# Patient Record
Sex: Female | Born: 1945
Health system: Southern US, Community
[De-identification: ages and names within clinical notes are randomized; demographics above are authoritative.]

## PROBLEM LIST (undated history)

## (undated) DIAGNOSIS — C4491 Basal cell carcinoma of skin, unspecified: Secondary | ICD-10-CM

## (undated) DIAGNOSIS — Z86010 Personal history of colonic polyps: Secondary | ICD-10-CM

## (undated) DIAGNOSIS — K635 Polyp of colon: Secondary | ICD-10-CM

## (undated) DIAGNOSIS — E785 Hyperlipidemia, unspecified: Secondary | ICD-10-CM

## (undated) DIAGNOSIS — M8580 Other specified disorders of bone density and structure, unspecified site: Secondary | ICD-10-CM

## (undated) DIAGNOSIS — I10 Essential (primary) hypertension: Secondary | ICD-10-CM

## (undated) HISTORY — PX: COLONOSCOPY: SHX174

## (undated) HISTORY — DX: Essential (primary) hypertension: I10

## (undated) HISTORY — DX: Personal history of colonic polyps: Z86.010

## (undated) HISTORY — DX: Other specified disorders of bone density and structure, unspecified site: M85.80

## (undated) HISTORY — DX: Hyperlipidemia, unspecified: E78.5

## (undated) HISTORY — DX: Polyp of colon: K63.5

## (undated) HISTORY — DX: Basal cell carcinoma of skin, unspecified: C44.91

---

## 1996-08-19 HISTORY — PX: DILATION AND CURETTAGE OF UTERUS: SHX78

## 1998-09-18 ENCOUNTER — Other Ambulatory Visit: Admission: RE | Admit: 1998-09-18 | Discharge: 1998-09-18 | Payer: Self-pay | Admitting: Obstetrics and Gynecology

## 1999-10-25 ENCOUNTER — Other Ambulatory Visit: Admission: RE | Admit: 1999-10-25 | Discharge: 1999-10-25 | Payer: Self-pay | Admitting: Obstetrics and Gynecology

## 1999-11-13 ENCOUNTER — Encounter: Payer: Self-pay | Admitting: Obstetrics and Gynecology

## 1999-11-13 ENCOUNTER — Encounter: Admission: RE | Admit: 1999-11-13 | Discharge: 1999-11-13 | Payer: Self-pay | Admitting: Obstetrics and Gynecology

## 2000-11-03 ENCOUNTER — Other Ambulatory Visit: Admission: RE | Admit: 2000-11-03 | Discharge: 2000-11-03 | Payer: Self-pay | Admitting: Obstetrics and Gynecology

## 2000-11-20 ENCOUNTER — Encounter: Admission: RE | Admit: 2000-11-20 | Discharge: 2000-11-20 | Payer: Self-pay | Admitting: Obstetrics and Gynecology

## 2000-11-20 ENCOUNTER — Encounter: Payer: Self-pay | Admitting: Obstetrics and Gynecology

## 2001-11-05 ENCOUNTER — Other Ambulatory Visit: Admission: RE | Admit: 2001-11-05 | Discharge: 2001-11-05 | Payer: Self-pay | Admitting: Obstetrics and Gynecology

## 2001-11-27 ENCOUNTER — Encounter: Payer: Self-pay | Admitting: Obstetrics and Gynecology

## 2001-11-27 ENCOUNTER — Encounter: Admission: RE | Admit: 2001-11-27 | Discharge: 2001-11-27 | Payer: Self-pay | Admitting: Obstetrics and Gynecology

## 2002-11-08 ENCOUNTER — Other Ambulatory Visit: Admission: RE | Admit: 2002-11-08 | Discharge: 2002-11-08 | Payer: Self-pay | Admitting: Obstetrics and Gynecology

## 2002-12-01 ENCOUNTER — Encounter: Payer: Self-pay | Admitting: Obstetrics and Gynecology

## 2002-12-01 ENCOUNTER — Encounter: Admission: RE | Admit: 2002-12-01 | Discharge: 2002-12-01 | Payer: Self-pay | Admitting: Obstetrics and Gynecology

## 2003-09-14 ENCOUNTER — Other Ambulatory Visit: Admission: RE | Admit: 2003-09-14 | Discharge: 2003-09-14 | Payer: Self-pay | Admitting: Family Medicine

## 2003-12-02 ENCOUNTER — Ambulatory Visit (HOSPITAL_COMMUNITY): Admission: RE | Admit: 2003-12-02 | Discharge: 2003-12-02 | Payer: Self-pay | Admitting: Family Medicine

## 2004-10-08 ENCOUNTER — Other Ambulatory Visit: Admission: RE | Admit: 2004-10-08 | Discharge: 2004-10-08 | Payer: Self-pay | Admitting: Family Medicine

## 2004-11-02 ENCOUNTER — Ambulatory Visit: Payer: Self-pay | Admitting: Internal Medicine

## 2004-11-15 ENCOUNTER — Ambulatory Visit: Payer: Self-pay | Admitting: Internal Medicine

## 2004-12-31 ENCOUNTER — Encounter: Admission: RE | Admit: 2004-12-31 | Discharge: 2004-12-31 | Payer: Self-pay | Admitting: Family Medicine

## 2005-10-16 ENCOUNTER — Other Ambulatory Visit: Admission: RE | Admit: 2005-10-16 | Discharge: 2005-10-16 | Payer: Self-pay | Admitting: Family Medicine

## 2006-01-02 ENCOUNTER — Encounter: Admission: RE | Admit: 2006-01-02 | Discharge: 2006-01-02 | Payer: Self-pay | Admitting: Family Medicine

## 2006-11-27 ENCOUNTER — Other Ambulatory Visit: Admission: RE | Admit: 2006-11-27 | Discharge: 2006-11-27 | Payer: Self-pay | Admitting: Family Medicine

## 2007-01-05 ENCOUNTER — Encounter: Admission: RE | Admit: 2007-01-05 | Discharge: 2007-01-05 | Payer: Self-pay | Admitting: Family Medicine

## 2007-12-08 ENCOUNTER — Other Ambulatory Visit: Admission: RE | Admit: 2007-12-08 | Discharge: 2007-12-08 | Payer: Self-pay | Admitting: Family Medicine

## 2008-01-06 ENCOUNTER — Encounter: Admission: RE | Admit: 2008-01-06 | Discharge: 2008-01-06 | Payer: Self-pay | Admitting: Family Medicine

## 2008-01-18 ENCOUNTER — Encounter: Admission: RE | Admit: 2008-01-18 | Discharge: 2008-01-18 | Payer: Self-pay | Admitting: Family Medicine

## 2009-01-06 ENCOUNTER — Encounter: Admission: RE | Admit: 2009-01-06 | Discharge: 2009-01-06 | Payer: Self-pay | Admitting: Family Medicine

## 2009-12-12 ENCOUNTER — Other Ambulatory Visit: Admission: RE | Admit: 2009-12-12 | Discharge: 2009-12-12 | Payer: Self-pay | Admitting: Family Medicine

## 2010-01-08 ENCOUNTER — Encounter: Admission: RE | Admit: 2010-01-08 | Discharge: 2010-01-08 | Payer: Self-pay | Admitting: Family Medicine

## 2010-09-09 ENCOUNTER — Encounter: Payer: Self-pay | Admitting: Family Medicine

## 2010-12-17 ENCOUNTER — Other Ambulatory Visit: Payer: Self-pay | Admitting: Family Medicine

## 2010-12-17 ENCOUNTER — Other Ambulatory Visit (HOSPITAL_COMMUNITY): Payer: Self-pay | Admitting: Family Medicine

## 2010-12-17 DIAGNOSIS — Z1231 Encounter for screening mammogram for malignant neoplasm of breast: Secondary | ICD-10-CM

## 2011-01-11 ENCOUNTER — Ambulatory Visit
Admission: RE | Admit: 2011-01-11 | Discharge: 2011-01-11 | Disposition: A | Discharge status: Home / Self Care | Payer: MEDICARE | Source: Ambulatory Visit | Attending: Family Medicine | Admitting: Family Medicine

## 2011-01-11 DIAGNOSIS — Z1231 Encounter for screening mammogram for malignant neoplasm of breast: Secondary | ICD-10-CM

## 2011-10-21 ENCOUNTER — Encounter: Payer: Self-pay | Admitting: Internal Medicine

## 2011-12-23 ENCOUNTER — Other Ambulatory Visit: Payer: Self-pay | Admitting: Family Medicine

## 2011-12-23 DIAGNOSIS — Z1231 Encounter for screening mammogram for malignant neoplasm of breast: Secondary | ICD-10-CM

## 2012-01-22 ENCOUNTER — Ambulatory Visit
Admission: RE | Admit: 2012-01-22 | Discharge: 2012-01-22 | Disposition: A | Discharge status: Home / Self Care | Payer: Medicare Other | Source: Ambulatory Visit | Attending: Family Medicine | Admitting: Family Medicine

## 2012-01-22 DIAGNOSIS — Z1231 Encounter for screening mammogram for malignant neoplasm of breast: Secondary | ICD-10-CM

## 2012-02-04 ENCOUNTER — Other Ambulatory Visit: Payer: Self-pay

## 2012-04-15 ENCOUNTER — Other Ambulatory Visit: Payer: Self-pay

## 2012-08-19 DIAGNOSIS — K635 Polyp of colon: Secondary | ICD-10-CM

## 2012-08-19 HISTORY — DX: Polyp of colon: K63.5

## 2012-09-10 ENCOUNTER — Encounter: Payer: Self-pay | Admitting: Internal Medicine

## 2012-09-25 ENCOUNTER — Encounter: Payer: Self-pay | Admitting: *Deleted

## 2012-10-05 ENCOUNTER — Encounter: Payer: Self-pay | Admitting: Internal Medicine

## 2012-10-09 ENCOUNTER — Encounter: Payer: Self-pay | Admitting: *Deleted

## 2012-11-04 ENCOUNTER — Ambulatory Visit (INDEPENDENT_AMBULATORY_CARE_PROVIDER_SITE_OTHER): Payer: Medicare Other | Admitting: Internal Medicine

## 2012-11-04 ENCOUNTER — Encounter: Payer: Self-pay | Admitting: Internal Medicine

## 2012-11-04 VITALS — BP 134/80 | HR 76 | Ht <= 58 in | Wt 132.5 lb

## 2012-11-04 DIAGNOSIS — R198 Other specified symptoms and signs involving the digestive system and abdomen: Secondary | ICD-10-CM

## 2012-11-04 MED ORDER — MOVIPREP 100 G PO SOLR: 1.0000 | Freq: Once | ORAL | Status: DC

## 2012-11-04 NOTE — Patient Instructions (Addendum)
You have been scheduled for an colonoscopy with propofol. Please follow written instructions given to you at your visit today. If you use inhalers (even only as needed), please bring them with you on the day of your procedure.   CC: Dr Merri Brunette

## 2012-11-04 NOTE — Progress Notes (Signed)
Katherine Chang [birth year] MRN [id number]  History of Present Illness:  This is a 66 year old white female with change in her bowel habits of several months duration. She describes narrow stools which are soft. There is no abdominal pain or bleeding. She claims to be eating a high fiber diet but her level of activity has diminished in the past few years. She has gained 15 pounds since I saw her in 2006. There is a family history of colon cancer in her paternal grandmother. Her medications include a beta blocker, calcium supplements and simvastatin. She recently had a home test for occult blood in the stool given by Dr. Katrinka Blazing which was negative.   Past Medical History  Diagnosis Date  . Hypertension   . Hyperlipidemia   . Osteopenia   . Basal cell carcinoma    Past Surgical History  Procedure Laterality Date  . Dilation and curettage of uterus      reports that she has never smoked. She has never used smokeless tobacco. She reports that she does not drink alcohol or use illicit drugs. family history includes Breast cancer in her paternal aunt; Colon cancer in her maternal grandmother; Diabetes in her mother; Hypertension in her mother; Lung cancer in her maternal grandfather; and Stroke in her paternal grandmother. No Known Allergies      Review of Systems: Negative for abdominal pain rectal bleeding chest pain  The remainder of the 10 point ROS is negative except as outlined in H&P   Physical Exam: General appearance  Well developed, in no distress. Eyes- non icteric. HEENT nontraumatic, normocephalic. Mouth no lesions, tongue papillated, no cheilosis. Neck supple without adenopathy, thyroid not enlarged, no carotid bruits, no JVD. Lungs Clear to auscultation bilaterally. Cor normal S1, normal S2, regular rhythm, no murmur,  quiet precordium. Abdomen: Soft relaxed abdomen with normal active bowel sounds. No tenderness. No fullness or mass. Liver edge at costal margin. Rectal:  Large amount of soft Hemoccult negative stool in the rectal ampulla. Extremities no pedal edema. Skin no lesions. Neurological alert and oriented x 3. Psychological normal mood and affect.  Assessment and Plan:  Problem #59 67 year old white female with change in the caliber of her stools which is concerning for possible malignancy. She is Hemoccult-negative. There is family history of colon cancer in an indirect relative and she is due for a screening colonoscopy. We have discussed increasing the fiber in her diet; specifically adding Benefiber to her cereal in the morning which would increase the bulk in the stool. She will continue to drink juice and start walking in [facility] 3 times a week like she did last year. We will schedule her for a colonoscopy. We discussed the prep, sedation and the procedure.   11/04/2012 Lina Sar

## 2012-11-05 ENCOUNTER — Encounter: Payer: Self-pay | Admitting: Internal Medicine

## 2012-11-18 ENCOUNTER — Telehealth: Payer: Self-pay | Admitting: Internal Medicine

## 2012-11-18 NOTE — Telephone Encounter (Signed)
Patient had questions regarding the amount of liquid that she needed to drink throughout the day on the day before her prep as well as questions regarding propofol sedation and nausea. Questions have been answered.

## 2012-11-20 ENCOUNTER — Ambulatory Visit (AMBULATORY_SURGERY_CENTER): Payer: Medicare Other | Admitting: Internal Medicine

## 2012-11-20 ENCOUNTER — Encounter: Payer: Self-pay | Admitting: Internal Medicine

## 2012-11-20 VITALS — BP 109/64 | HR 75 | Temp 98.7°F | Resp 19 | Ht <= 58 in | Wt 132.0 lb

## 2012-11-20 DIAGNOSIS — D126 Benign neoplasm of colon, unspecified: Secondary | ICD-10-CM

## 2012-11-20 DIAGNOSIS — Z860100 Personal history of colon polyps, unspecified: Secondary | ICD-10-CM

## 2012-11-20 DIAGNOSIS — R198 Other specified symptoms and signs involving the digestive system and abdomen: Secondary | ICD-10-CM

## 2012-11-20 HISTORY — DX: Personal history of colonic polyps: Z86.010

## 2012-11-20 HISTORY — DX: Personal history of colon polyps, unspecified: Z86.0100

## 2012-11-20 MED ORDER — SODIUM CHLORIDE 0.9 % IV SOLN: 500.0000 mL | INTRAVENOUS | Status: DC

## 2012-11-20 NOTE — Patient Instructions (Addendum)

## 2012-11-20 NOTE — Progress Notes (Signed)
Called to room to assist during endoscopic procedure.  Patient ID and intended procedure confirmed with present staff. Received instructions for my participation in the procedure from the performing physician. ewm 

## 2012-11-20 NOTE — Op Note (Signed)
[facility] [address] [place], [address]   COLONOSCOPY PROCEDURE REPORT  PATIENT: Katherine, Chang  MR#: [id number] BIRTHDATE: Oct 17, 1945 , 67  yrs. old GENDER: Female ENDOSCOPIST: Hart Carwin, MD REFERRED BY:  Merri Brunette, M.D. PROCEDURE DATE:  11/20/2012 PROCEDURE:   Colonoscopy with snare polypectomy ASA CLASS:   Class II INDICATIONS:Average risk patient for colon cancer and Family history of colon cancer, distant relative(s).(grandparent), last colon about 8 years ago MEDICATIONS: MAC sedation, administered by CRNA and propofol (Diprivan) 300mg  IV  DESCRIPTION OF PROCEDURE:   After the risks and benefits and of the procedure were explained, informed consent was obtained.  A digital rectal exam revealed no abnormalities of the rectum.    The LB PCF-[id number]  endoscope was introduced through the anus and advanced to the cecum, which was identified by both the appendix and ileocecal valve .  The quality of the prep was good, using MoviPrep .  The instrument was then slowly withdrawn as the colon was fully examined.     COLON FINDINGS: A smooth sessile polyp ranging between 5-68mm in size was found in the ascending colon.  A polypectomy was performed with a cold snare.  The resection was complete and the polyp tissue was completely retrieved.     Retroflexed views revealed no abnormalities.     The scope was then withdrawn from the patient and the procedure completed.  COMPLICATIONS: There were no complications. ENDOSCOPIC IMPRESSION: Sessile polyp ranging between 5-37mm in size was found in the ascending colon; polypectomy was performed with a cold snare  RECOMMENDATIONS: 1.  Await pathology results 2.  High fiber diet   REPEAT EXAM: for Colonoscopy, pending biopsy results.  cc:  _______________________________ eSignedHart Carwin, MD 11/20/2012 12:44 PM     PATIENT NAME:  Katherine, Chang MR#: [id number]

## 2012-11-23 ENCOUNTER — Telehealth: Payer: Self-pay | Admitting: *Deleted

## 2012-11-23 NOTE — Telephone Encounter (Signed)
  Follow up Call-  Call back number 11/20/2012  Post procedure Call Back phone  # [phone]  Permission to leave phone message Yes     Patient questions:  Do you have a fever, pain , or abdominal swelling? no Pain Score  0 *  Have you tolerated food without any problems? yes  Have you been able to return to your normal activities? yes  Do you have any questions about your discharge instructions: Diet   no Medications  no Follow up visit  no  Do you have questions or concerns about your Care? no  Actions: * If pain score is 4 or above: No action needed, pain <4.

## 2012-11-24 ENCOUNTER — Encounter: Payer: Self-pay | Admitting: Internal Medicine

## 2012-12-30 ENCOUNTER — Other Ambulatory Visit: Payer: Self-pay

## 2012-12-30 DIAGNOSIS — Z1231 Encounter for screening mammogram for malignant neoplasm of breast: Secondary | ICD-10-CM

## 2013-02-02 ENCOUNTER — Ambulatory Visit
Admission: RE | Admit: 2013-02-02 | Discharge: 2013-02-02 | Disposition: A | Discharge status: Home / Self Care | Payer: Medicare Other | Source: Ambulatory Visit

## 2013-02-02 DIAGNOSIS — Z1231 Encounter for screening mammogram for malignant neoplasm of breast: Secondary | ICD-10-CM

## 2013-02-03 ENCOUNTER — Ambulatory Visit: Payer: Medicare Other

## 2014-01-06 ENCOUNTER — Other Ambulatory Visit: Payer: Self-pay

## 2014-01-06 DIAGNOSIS — Z1231 Encounter for screening mammogram for malignant neoplasm of breast: Secondary | ICD-10-CM

## 2014-02-08 ENCOUNTER — Ambulatory Visit
Admission: RE | Admit: 2014-02-08 | Discharge: 2014-02-08 | Disposition: A | Discharge status: Home / Self Care | Payer: Medicare Other | Source: Ambulatory Visit

## 2014-02-08 ENCOUNTER — Encounter (INDEPENDENT_AMBULATORY_CARE_PROVIDER_SITE_OTHER): Payer: Self-pay

## 2014-02-08 DIAGNOSIS — Z1231 Encounter for screening mammogram for malignant neoplasm of breast: Secondary | ICD-10-CM

## 2014-06-14 ENCOUNTER — Other Ambulatory Visit: Payer: Self-pay

## 2015-01-06 ENCOUNTER — Other Ambulatory Visit: Payer: Self-pay

## 2015-01-06 DIAGNOSIS — Z1231 Encounter for screening mammogram for malignant neoplasm of breast: Secondary | ICD-10-CM

## 2015-02-13 ENCOUNTER — Ambulatory Visit
Admission: RE | Admit: 2015-02-13 | Discharge: 2015-02-13 | Disposition: A | Discharge status: Home / Self Care | Payer: Medicare Other | Source: Ambulatory Visit

## 2015-02-13 DIAGNOSIS — Z1231 Encounter for screening mammogram for malignant neoplasm of breast: Secondary | ICD-10-CM

## 2016-01-01 DIAGNOSIS — R609 Edema, unspecified: Secondary | ICD-10-CM | POA: Diagnosis not present

## 2016-01-01 DIAGNOSIS — I10 Essential (primary) hypertension: Secondary | ICD-10-CM | POA: Diagnosis not present

## 2016-01-01 DIAGNOSIS — E7800 Pure hypercholesterolemia, unspecified: Secondary | ICD-10-CM | POA: Diagnosis not present

## 2016-01-08 ENCOUNTER — Other Ambulatory Visit: Payer: Self-pay

## 2016-01-08 DIAGNOSIS — Z1231 Encounter for screening mammogram for malignant neoplasm of breast: Secondary | ICD-10-CM

## 2016-02-14 ENCOUNTER — Ambulatory Visit
Admission: RE | Admit: 2016-02-14 | Discharge: 2016-02-14 | Disposition: A | Discharge status: Home / Self Care | Payer: PPO | Source: Ambulatory Visit

## 2016-02-14 DIAGNOSIS — Z1231 Encounter for screening mammogram for malignant neoplasm of breast: Secondary | ICD-10-CM

## 2016-05-06 DIAGNOSIS — H43813 Vitreous degeneration, bilateral: Secondary | ICD-10-CM | POA: Diagnosis not present

## 2016-05-06 DIAGNOSIS — H25813 Combined forms of age-related cataract, bilateral: Secondary | ICD-10-CM | POA: Diagnosis not present

## 2016-05-06 DIAGNOSIS — H524 Presbyopia: Secondary | ICD-10-CM | POA: Diagnosis not present

## 2016-05-06 DIAGNOSIS — H04123 Dry eye syndrome of bilateral lacrimal glands: Secondary | ICD-10-CM | POA: Diagnosis not present

## 2016-06-18 DIAGNOSIS — D1801 Hemangioma of skin and subcutaneous tissue: Secondary | ICD-10-CM | POA: Diagnosis not present

## 2016-06-18 DIAGNOSIS — L84 Corns and callosities: Secondary | ICD-10-CM | POA: Diagnosis not present

## 2016-06-18 DIAGNOSIS — L918 Other hypertrophic disorders of the skin: Secondary | ICD-10-CM | POA: Diagnosis not present

## 2016-06-18 DIAGNOSIS — L819 Disorder of pigmentation, unspecified: Secondary | ICD-10-CM | POA: Diagnosis not present

## 2016-06-18 DIAGNOSIS — L720 Epidermal cyst: Secondary | ICD-10-CM | POA: Diagnosis not present

## 2016-06-18 DIAGNOSIS — Z85828 Personal history of other malignant neoplasm of skin: Secondary | ICD-10-CM | POA: Diagnosis not present

## 2016-06-18 DIAGNOSIS — L821 Other seborrheic keratosis: Secondary | ICD-10-CM | POA: Diagnosis not present

## 2016-06-25 DIAGNOSIS — Z23 Encounter for immunization: Secondary | ICD-10-CM | POA: Diagnosis not present

## 2016-06-25 DIAGNOSIS — Z1159 Encounter for screening for other viral diseases: Secondary | ICD-10-CM | POA: Diagnosis not present

## 2016-06-25 DIAGNOSIS — Z1389 Encounter for screening for other disorder: Secondary | ICD-10-CM | POA: Diagnosis not present

## 2016-06-25 DIAGNOSIS — Z0000 Encounter for general adult medical examination without abnormal findings: Secondary | ICD-10-CM | POA: Diagnosis not present

## 2016-06-25 DIAGNOSIS — E7800 Pure hypercholesterolemia, unspecified: Secondary | ICD-10-CM | POA: Diagnosis not present

## 2016-06-25 DIAGNOSIS — M8580 Other specified disorders of bone density and structure, unspecified site: Secondary | ICD-10-CM | POA: Diagnosis not present

## 2016-06-25 DIAGNOSIS — I10 Essential (primary) hypertension: Secondary | ICD-10-CM | POA: Diagnosis not present

## 2016-10-18 ENCOUNTER — Telehealth: Payer: Self-pay | Admitting: Gastroenterology

## 2016-10-18 ENCOUNTER — Encounter: Payer: Self-pay | Admitting: Internal Medicine

## 2016-10-18 NOTE — Telephone Encounter (Signed)
Patient is a former Dr. [name] pt. She is requesting you take over her care, as her son, Katherine Chang, is a patient of yours. Will you accept her as your patient?  Patient was just verifying that she is due for her next colonoscopy in 11/2017, as her PCP gave her recommendations for colonoscopy every 2 years. Patient has not had any changes in her bowel habits, no rectal bleeding or pain.  Please advise.

## 2016-10-18 NOTE — Telephone Encounter (Signed)
I am happy to accept  As best I can tell her colonoscopy should not be repeated until 2019 - I have reviewed the chart  The only reason I would think to do it every 2 years would be if she had something called Lynch syndrome - a genetic cancer syndrome - which do not see in chart  Perhaps there was a miscommunication

## 2016-10-18 NOTE — Telephone Encounter (Signed)
Spoke to patient, let her know that Dr. Carlean Purl said he would be happy to accept her as a patient. She understands that she will be contacted next year to schedule a colonoscopy.

## 2016-12-23 DIAGNOSIS — E7800 Pure hypercholesterolemia, unspecified: Secondary | ICD-10-CM | POA: Diagnosis not present

## 2016-12-23 DIAGNOSIS — M8580 Other specified disorders of bone density and structure, unspecified site: Secondary | ICD-10-CM | POA: Diagnosis not present

## 2016-12-23 DIAGNOSIS — I10 Essential (primary) hypertension: Secondary | ICD-10-CM | POA: Diagnosis not present

## 2017-01-10 ENCOUNTER — Other Ambulatory Visit: Payer: Self-pay | Admitting: Family Medicine

## 2017-01-10 DIAGNOSIS — Z1231 Encounter for screening mammogram for malignant neoplasm of breast: Secondary | ICD-10-CM

## 2017-02-14 ENCOUNTER — Ambulatory Visit
Admission: RE | Admit: 2017-02-14 | Discharge: 2017-02-14 | Disposition: A | Discharge status: Home / Self Care | Payer: PPO | Source: Ambulatory Visit | Attending: Family Medicine | Admitting: Family Medicine

## 2017-02-14 DIAGNOSIS — Z1231 Encounter for screening mammogram for malignant neoplasm of breast: Secondary | ICD-10-CM

## 2017-03-25 DIAGNOSIS — Z85828 Personal history of other malignant neoplasm of skin: Secondary | ICD-10-CM | POA: Diagnosis not present

## 2017-03-25 DIAGNOSIS — S20162A Insect bite (nonvenomous) of breast, left breast, initial encounter: Secondary | ICD-10-CM | POA: Diagnosis not present

## 2017-03-25 DIAGNOSIS — L820 Inflamed seborrheic keratosis: Secondary | ICD-10-CM | POA: Diagnosis not present

## 2017-06-11 DIAGNOSIS — Z23 Encounter for immunization: Secondary | ICD-10-CM | POA: Diagnosis not present

## 2017-06-18 DIAGNOSIS — D485 Neoplasm of uncertain behavior of skin: Secondary | ICD-10-CM | POA: Diagnosis not present

## 2017-06-18 DIAGNOSIS — Z85828 Personal history of other malignant neoplasm of skin: Secondary | ICD-10-CM | POA: Diagnosis not present

## 2017-06-18 DIAGNOSIS — L819 Disorder of pigmentation, unspecified: Secondary | ICD-10-CM | POA: Diagnosis not present

## 2017-06-18 DIAGNOSIS — L821 Other seborrheic keratosis: Secondary | ICD-10-CM | POA: Diagnosis not present

## 2017-06-18 DIAGNOSIS — L820 Inflamed seborrheic keratosis: Secondary | ICD-10-CM | POA: Diagnosis not present

## 2017-06-18 DIAGNOSIS — L84 Corns and callosities: Secondary | ICD-10-CM | POA: Diagnosis not present

## 2017-07-21 DIAGNOSIS — E7800 Pure hypercholesterolemia, unspecified: Secondary | ICD-10-CM | POA: Diagnosis not present

## 2017-07-21 DIAGNOSIS — I10 Essential (primary) hypertension: Secondary | ICD-10-CM | POA: Diagnosis not present

## 2017-07-21 DIAGNOSIS — Z1389 Encounter for screening for other disorder: Secondary | ICD-10-CM | POA: Diagnosis not present

## 2017-07-21 DIAGNOSIS — Z6828 Body mass index (BMI) 28.0-28.9, adult: Secondary | ICD-10-CM | POA: Diagnosis not present

## 2017-07-21 DIAGNOSIS — M8580 Other specified disorders of bone density and structure, unspecified site: Secondary | ICD-10-CM | POA: Diagnosis not present

## 2017-07-21 DIAGNOSIS — Z0000 Encounter for general adult medical examination without abnormal findings: Secondary | ICD-10-CM | POA: Diagnosis not present

## 2017-07-21 DIAGNOSIS — M859 Disorder of bone density and structure, unspecified: Secondary | ICD-10-CM | POA: Diagnosis not present

## 2017-12-11 ENCOUNTER — Encounter: Payer: Self-pay | Admitting: Internal Medicine

## 2018-01-16 ENCOUNTER — Other Ambulatory Visit: Payer: Self-pay | Admitting: Family Medicine

## 2018-01-16 DIAGNOSIS — Z1231 Encounter for screening mammogram for malignant neoplasm of breast: Secondary | ICD-10-CM

## 2018-01-19 DIAGNOSIS — E7800 Pure hypercholesterolemia, unspecified: Secondary | ICD-10-CM | POA: Diagnosis not present

## 2018-01-19 DIAGNOSIS — I10 Essential (primary) hypertension: Secondary | ICD-10-CM | POA: Diagnosis not present

## 2018-02-25 ENCOUNTER — Ambulatory Visit
Admission: RE | Admit: 2018-02-25 | Discharge: 2018-02-25 | Disposition: A | Discharge status: Home / Self Care | Payer: PPO | Source: Ambulatory Visit | Attending: Family Medicine | Admitting: Family Medicine

## 2018-02-25 DIAGNOSIS — Z1231 Encounter for screening mammogram for malignant neoplasm of breast: Secondary | ICD-10-CM | POA: Diagnosis not present

## 2018-03-16 DIAGNOSIS — M8588 Other specified disorders of bone density and structure, other site: Secondary | ICD-10-CM | POA: Diagnosis not present

## 2018-05-18 ENCOUNTER — Encounter: Payer: Self-pay | Admitting: Internal Medicine

## 2018-06-19 DIAGNOSIS — D225 Melanocytic nevi of trunk: Secondary | ICD-10-CM | POA: Diagnosis not present

## 2018-06-19 DIAGNOSIS — Z419 Encounter for procedure for purposes other than remedying health state, unspecified: Secondary | ICD-10-CM | POA: Diagnosis not present

## 2018-06-19 DIAGNOSIS — Z85828 Personal history of other malignant neoplasm of skin: Secondary | ICD-10-CM | POA: Diagnosis not present

## 2018-06-19 DIAGNOSIS — L84 Corns and callosities: Secondary | ICD-10-CM | POA: Diagnosis not present

## 2018-06-19 DIAGNOSIS — D2272 Melanocytic nevi of left lower limb, including hip: Secondary | ICD-10-CM | POA: Diagnosis not present

## 2018-06-19 DIAGNOSIS — D224 Melanocytic nevi of scalp and neck: Secondary | ICD-10-CM | POA: Diagnosis not present

## 2018-06-19 DIAGNOSIS — L821 Other seborrheic keratosis: Secondary | ICD-10-CM | POA: Diagnosis not present

## 2018-06-19 DIAGNOSIS — D1801 Hemangioma of skin and subcutaneous tissue: Secondary | ICD-10-CM | POA: Diagnosis not present

## 2018-06-22 ENCOUNTER — Ambulatory Visit (AMBULATORY_SURGERY_CENTER): Payer: Self-pay | Admitting: *Deleted

## 2018-06-22 VITALS — Ht <= 58 in | Wt 137.0 lb

## 2018-06-22 DIAGNOSIS — Z86010 Personal history of colonic polyps: Secondary | ICD-10-CM

## 2018-06-22 NOTE — Progress Notes (Signed)
Patient denies any allergies to eggs or soy. Patient denies any problems with anesthesia/sedation. Patient denies any oxygen use at home. Patient denies taking any diet/weight loss medications or blood thinners. EMMI education assisgned to patient on colonoscopy, this was explained and instructions given to patient. 

## 2018-06-23 ENCOUNTER — Encounter: Payer: Self-pay | Admitting: Internal Medicine

## 2018-06-24 DIAGNOSIS — Z23 Encounter for immunization: Secondary | ICD-10-CM | POA: Diagnosis not present

## 2018-07-06 ENCOUNTER — Encounter: Payer: Self-pay | Admitting: Internal Medicine

## 2018-07-06 ENCOUNTER — Ambulatory Visit (AMBULATORY_SURGERY_CENTER): Payer: PPO | Admitting: Internal Medicine

## 2018-07-06 VITALS — BP 130/75 | HR 72 | Temp 97.5°F | Resp 13 | Ht <= 58 in | Wt 137.0 lb

## 2018-07-06 DIAGNOSIS — Z86010 Personal history of colonic polyps: Secondary | ICD-10-CM

## 2018-07-06 DIAGNOSIS — D122 Benign neoplasm of ascending colon: Secondary | ICD-10-CM | POA: Diagnosis not present

## 2018-07-06 DIAGNOSIS — D120 Benign neoplasm of cecum: Secondary | ICD-10-CM | POA: Diagnosis not present

## 2018-07-06 DIAGNOSIS — I10 Essential (primary) hypertension: Secondary | ICD-10-CM | POA: Diagnosis not present

## 2018-07-06 MED ORDER — SODIUM CHLORIDE 0.9 % IV SOLN: 500.0000 mL | Freq: Once | INTRAVENOUS | Status: DC

## 2018-07-06 NOTE — Progress Notes (Signed)
Pt's states no medical or surgical changes since previsit or office visit. 

## 2018-07-06 NOTE — Progress Notes (Signed)
Report given to PACU, vss 

## 2018-07-06 NOTE — Op Note (Signed)
[facility] Patient Name: Katherine Chang Procedure Date: 07/06/2018 11:10 AM MRN: [id number] Endoscopist: Gatha Mayer , MD Age: 72 Referring MD:  Date of Birth: 1946-07-22 Gender: Female Account #: [id number] Procedure:                Colonoscopy Indications:              Surveillance: Personal history of adenomatous                            polyps on last colonoscopy 5 years ago Medicines:                Propofol per Anesthesia, Monitored Anesthesia Care Procedure:                Pre-Anesthesia Assessment:                           - Prior to the procedure, a History and Physical                            was performed, and patient medications and                            allergies were reviewed. The patient's tolerance of                            previous anesthesia was also reviewed. The risks                            and benefits of the procedure and the sedation                            options and risks were discussed with the patient.                            All questions were answered, and informed consent                            was obtained. Prior Anticoagulants: The patient has                            taken no previous anticoagulant or antiplatelet                            agents. ASA Grade Assessment: II - A patient with                            mild systemic disease. After reviewing the risks                            and benefits, the patient was deemed in                            satisfactory condition to undergo the procedure.  After obtaining informed consent, the colonoscope                            was passed under direct vision. Throughout the                            procedure, the patient's blood pressure, pulse, and                            oxygen saturations were monitored continuously. The                            Model PCF-[id number]) scope was introduced    through the anus and advanced to the the cecum,                            identified by appendiceal orifice and ileocecal                            valve. The colonoscopy was performed without                            difficulty. The patient tolerated the procedure                            well. The quality of the bowel preparation was                            good. The ileocecal valve, appendiceal orifice, and                            rectum were photographed. The bowel preparation                            used was Miralax. Scope In: 11:21:12 AM Scope Out: 11:36:07 AM Scope Withdrawal Time: 0 hours 12 minutes 34 seconds  Total Procedure Duration: 0 hours 14 minutes 55 seconds  Findings:                 The perianal and digital rectal examinations were                            normal.                           Two sessile polyps were found in the ascending                            colon and cecum. The polyps were diminutive in                            size. These polyps were removed with a cold snare.                            Resection and retrieval were complete.  Verification                            of patient identification for the specimen was                            done. Estimated blood loss was minimal.                           Multiple diverticula were found in the sigmoid                            colon.                           The exam was otherwise without abnormality on                            direct and retroflexion views. Complications:            No immediate complications. Estimated Blood Loss:     Estimated blood loss was minimal. Impression:               - Two diminutive polyps in the ascending colon and                            in the cecum, removed with a cold snare. Resected                            and retrieved.                           - Diverticulosis in the sigmoid colon.                           - The examination was  otherwise normal on direct                            and retroflexion views.                           - Personal history of colonic polyp 8 mm ssp 2014. Recommendation:           - Patient has a contact number available for                            emergencies. The signs and symptoms of potential                            delayed complications were discussed with the                            patient. Return to normal activities tomorrow.                            Written discharge instructions were provided to the  patient.                           - Resume previous diet.                           - Continue present medications.                           - Repeat colonoscopy is possibly recommended for                            surveillance. The colonoscopy date will be                            determined after pathology results from today's                            exam become available for review. 72 now so 77 in                            5years and may not need then. Gatha Mayer, MD 07/06/2018 11:44:53 AM This report has been signed electronically.

## 2018-07-06 NOTE — Patient Instructions (Addendum)
I found and removed 2 tiny polyps today.  I will let you know pathology results and when to have another routine colonoscopy by mail and/or My Chart.  I appreciate the opportunity to care for you. Gatha Mayer, MD, FACG YOU HAD AN ENDOSCOPIC PROCEDURE TODAY AT [facility]:   Refer to the procedure report that was given to you for any specific questions about what was found during the examination.  If the procedure report does not answer your questions, please call your gastroenterologist to clarify.  If you requested that your care partner not be given the details of your procedure findings, then the procedure report has been included in a sealed envelope for you to review at your convenience later.  YOU SHOULD EXPECT: Some feelings of bloating in the abdomen. Passage of more gas than usual.  Walking can help get rid of the air that was put into your GI tract during the procedure and reduce the bloating. If you had a lower endoscopy (such as a colonoscopy or flexible sigmoidoscopy) you may notice spotting of blood in your stool or on the toilet paper. If you underwent a bowel prep for your procedure, you may not have a normal bowel movement for a few days.  Please Note:  You might notice some irritation and congestion in your nose or some drainage.  This is from the oxygen used during your procedure.  There is no need for concern and it should clear up in a day or so.  SYMPTOMS TO REPORT IMMEDIATELY:   Following lower endoscopy (colonoscopy or flexible sigmoidoscopy):  Excessive amounts of blood in the stool  Significant tenderness or worsening of abdominal pains  Swelling of the abdomen that is new, acute  Fever of 100F or higher  For urgent or emergent issues, a gastroenterologist can be reached at any hour by calling [phone].   DIET:  We do recommend a small meal at first, but then you may proceed to your regular diet.  Drink plenty of fluids but you  should avoid alcoholic beverages for 24 hours.  MEDICATIONS:  Continue present medications.  Please see handouts given to you by your recovery nurse.  ACTIVITY:  You should plan to take it easy for the rest of today and you should NOT DRIVE or use heavy machinery until tomorrow (because of the sedation medicines used during the test).    FOLLOW UP: Our staff will call the number listed on your records the next business day following your procedure to check on you and address any questions or concerns that you may have regarding the information given to you following your procedure. If we do not reach you, we will leave a message.  However, if you are feeling well and you are not experiencing any problems, there is no need to return our call.  We will assume that you have returned to your regular daily activities without incident.  If any biopsies were taken you will be contacted by phone or by letter within the next 1-3 weeks.  Please call us at [phone] if you have not heard about the biopsies in 3 weeks.   Thank you for allowing [place] to provide for your healthcare needs today.   SIGNATURES/CONFIDENTIALITY: You and/or your care partner have signed paperwork which will be entered into your electronic medical record.  These signatures attest to the fact that that the information above on your After Visit Summary has been reviewed and is understood.  Full responsibility of the confidentiality of this discharge information lies with you and/or your care-partner.

## 2018-07-06 NOTE — Progress Notes (Signed)
Called to room to assist during endoscopic procedure.  Patient ID and intended procedure confirmed with present staff. Received instructions for my participation in the procedure from the performing physician.  

## 2018-07-07 ENCOUNTER — Telehealth: Payer: Self-pay | Admitting: *Deleted

## 2018-07-07 NOTE — Telephone Encounter (Signed)
  Follow up Call-  Call back number 07/06/2018  Post procedure Call Back phone  # [phone]  Permission to leave phone message Yes  Some recent data might be hidden     Patient questions:  Do you have a fever, pain , or abdominal swelling? No. Pain Score  0 *  Have you tolerated food without any problems? Yes.    Have you been able to return to your normal activities?yes  Do you have any questions about your discharge instructions: Diet   No. Medications  No. Follow up visit  No.  Do you have questions or concerns about your Care?no  Actions: * If pain score is 4 or above: No action needed, pain <4.

## 2018-07-07 NOTE — Telephone Encounter (Signed)
No answer for post procedure call back. Left message and will attempt to call back later this afternoon. Sm 

## 2018-07-12 ENCOUNTER — Encounter: Payer: Self-pay | Admitting: Internal Medicine

## 2018-07-12 NOTE — Progress Notes (Signed)
Diminutive ssp and adenoma Optional repeat colonoscopy 2024 (she will be 77) Will say probably not in letter and leave to her to contact us

## 2018-07-21 ENCOUNTER — Telehealth: Payer: Self-pay | Admitting: Internal Medicine

## 2018-07-21 NOTE — Telephone Encounter (Signed)
All questions were answered.  She will call back for any additional questions or concerns.

## 2018-07-22 DIAGNOSIS — I10 Essential (primary) hypertension: Secondary | ICD-10-CM | POA: Diagnosis not present

## 2018-07-22 DIAGNOSIS — Z1389 Encounter for screening for other disorder: Secondary | ICD-10-CM | POA: Diagnosis not present

## 2018-07-22 DIAGNOSIS — E7800 Pure hypercholesterolemia, unspecified: Secondary | ICD-10-CM | POA: Diagnosis not present

## 2018-07-22 DIAGNOSIS — Z0000 Encounter for general adult medical examination without abnormal findings: Secondary | ICD-10-CM | POA: Diagnosis not present

## 2018-07-22 DIAGNOSIS — M8580 Other specified disorders of bone density and structure, unspecified site: Secondary | ICD-10-CM | POA: Diagnosis not present

## 2019-01-18 ENCOUNTER — Other Ambulatory Visit: Payer: Self-pay | Admitting: Family Medicine

## 2019-01-18 DIAGNOSIS — Z1231 Encounter for screening mammogram for malignant neoplasm of breast: Secondary | ICD-10-CM

## 2019-01-21 DIAGNOSIS — I10 Essential (primary) hypertension: Secondary | ICD-10-CM | POA: Diagnosis not present

## 2019-01-21 DIAGNOSIS — E7800 Pure hypercholesterolemia, unspecified: Secondary | ICD-10-CM | POA: Diagnosis not present

## 2019-03-03 DIAGNOSIS — I10 Essential (primary) hypertension: Secondary | ICD-10-CM | POA: Diagnosis not present

## 2019-03-03 DIAGNOSIS — R7309 Other abnormal glucose: Secondary | ICD-10-CM | POA: Diagnosis not present

## 2019-03-03 DIAGNOSIS — E7800 Pure hypercholesterolemia, unspecified: Secondary | ICD-10-CM | POA: Diagnosis not present

## 2019-03-05 ENCOUNTER — Ambulatory Visit
Admission: RE | Admit: 2019-03-05 | Discharge: 2019-03-05 | Disposition: A | Discharge status: Home / Self Care | Payer: PPO | Source: Ambulatory Visit | Attending: Family Medicine | Admitting: Family Medicine

## 2019-03-05 ENCOUNTER — Other Ambulatory Visit: Payer: Self-pay

## 2019-03-05 DIAGNOSIS — Z1231 Encounter for screening mammogram for malignant neoplasm of breast: Secondary | ICD-10-CM

## 2019-03-06 ENCOUNTER — Other Ambulatory Visit: Payer: Self-pay | Admitting: Family Medicine

## 2019-03-06 DIAGNOSIS — I999 Unspecified disorder of circulatory system: Secondary | ICD-10-CM

## 2019-03-06 DIAGNOSIS — I739 Peripheral vascular disease, unspecified: Secondary | ICD-10-CM

## 2019-03-15 ENCOUNTER — Other Ambulatory Visit: Payer: PPO

## 2019-03-15 ENCOUNTER — Ambulatory Visit
Admission: RE | Admit: 2019-03-15 | Discharge: 2019-03-15 | Disposition: A | Discharge status: Home / Self Care | Payer: PPO | Source: Ambulatory Visit | Attending: Family Medicine | Admitting: Family Medicine

## 2019-03-15 DIAGNOSIS — I739 Peripheral vascular disease, unspecified: Secondary | ICD-10-CM

## 2019-03-15 DIAGNOSIS — Z8679 Personal history of other diseases of the circulatory system: Secondary | ICD-10-CM | POA: Diagnosis not present

## 2019-03-15 DIAGNOSIS — I999 Unspecified disorder of circulatory system: Secondary | ICD-10-CM

## 2019-05-12 ENCOUNTER — Other Ambulatory Visit: Payer: Self-pay

## 2019-05-12 DIAGNOSIS — Z20822 Contact with and (suspected) exposure to covid-19: Secondary | ICD-10-CM

## 2019-05-13 LAB — NOVEL CORONAVIRUS, NAA: SARS-CoV-2, NAA: NOT DETECTED

## 2019-05-13 LAB — SPECIMEN STATUS REPORT

## 2019-05-24 DIAGNOSIS — Z23 Encounter for immunization: Secondary | ICD-10-CM | POA: Diagnosis not present

## 2019-07-09 DIAGNOSIS — L821 Other seborrheic keratosis: Secondary | ICD-10-CM | POA: Diagnosis not present

## 2019-07-09 DIAGNOSIS — D2272 Melanocytic nevi of left lower limb, including hip: Secondary | ICD-10-CM | POA: Diagnosis not present

## 2019-07-09 DIAGNOSIS — D2339 Other benign neoplasm of skin of other parts of face: Secondary | ICD-10-CM | POA: Diagnosis not present

## 2019-07-09 DIAGNOSIS — L820 Inflamed seborrheic keratosis: Secondary | ICD-10-CM | POA: Diagnosis not present

## 2019-07-09 DIAGNOSIS — Z85828 Personal history of other malignant neoplasm of skin: Secondary | ICD-10-CM | POA: Diagnosis not present

## 2019-07-09 DIAGNOSIS — D2271 Melanocytic nevi of right lower limb, including hip: Secondary | ICD-10-CM | POA: Diagnosis not present

## 2019-07-09 DIAGNOSIS — D485 Neoplasm of uncertain behavior of skin: Secondary | ICD-10-CM | POA: Diagnosis not present

## 2019-07-09 DIAGNOSIS — D1801 Hemangioma of skin and subcutaneous tissue: Secondary | ICD-10-CM | POA: Diagnosis not present

## 2019-07-28 DIAGNOSIS — I10 Essential (primary) hypertension: Secondary | ICD-10-CM | POA: Diagnosis not present

## 2019-07-28 DIAGNOSIS — Z0000 Encounter for general adult medical examination without abnormal findings: Secondary | ICD-10-CM | POA: Diagnosis not present

## 2019-07-28 DIAGNOSIS — Z1389 Encounter for screening for other disorder: Secondary | ICD-10-CM | POA: Diagnosis not present

## 2019-07-28 DIAGNOSIS — E7800 Pure hypercholesterolemia, unspecified: Secondary | ICD-10-CM | POA: Diagnosis not present

## 2019-08-24 DIAGNOSIS — E7800 Pure hypercholesterolemia, unspecified: Secondary | ICD-10-CM | POA: Diagnosis not present

## 2019-08-24 DIAGNOSIS — I10 Essential (primary) hypertension: Secondary | ICD-10-CM | POA: Diagnosis not present

## 2019-09-13 ENCOUNTER — Ambulatory Visit: Payer: PPO | Attending: Internal Medicine

## 2019-09-13 DIAGNOSIS — Z23 Encounter for immunization: Secondary | ICD-10-CM | POA: Insufficient documentation

## 2019-09-13 NOTE — Progress Notes (Signed)
   Covid-19 Vaccination Clinic  Name:  Katherine Chang    MRN: [id number] DOB: 1946/07/13  09/13/2019  [name] was observed post Covid-19 immunization for 15 minutes without incidence. She was provided with Vaccine Information Sheet and instruction to access the V-Safe system.   [name] was instructed to call 911 with any severe reactions post vaccine: Marland Kitchen Difficulty breathing  . Swelling of your face and throat  . A fast heartbeat  . A bad rash all over your body  . Dizziness and weakness    Immunizations Administered    Name Date Dose VIS Date Route   Pfizer COVID-19 Vaccine 09/13/2019  9:09 AM 0.3 mL 07/30/2019 Intramuscular   Manufacturer: [place]   Lot: [id number]   [place]: [id number]

## 2019-10-04 ENCOUNTER — Ambulatory Visit: Payer: PPO

## 2019-10-04 ENCOUNTER — Ambulatory Visit: Payer: PPO | Attending: Internal Medicine

## 2019-10-04 DIAGNOSIS — Z23 Encounter for immunization: Secondary | ICD-10-CM

## 2019-10-04 NOTE — Progress Notes (Signed)
   Covid-19 Vaccination Clinic  Name:  Katherine Chang    MRN: [id number] DOB: Oct 13, 1945  10/04/2019  Ms. Lechtenberg was observed post Covid-19 immunization for 15 minutes without incidence. She was provided with Vaccine Information Sheet and instruction to access the V-Safe system.   Ms. Zittel was instructed to call 911 with any severe reactions post vaccine: Marland Kitchen Difficulty breathing  . Swelling of your face and throat  . A fast heartbeat  . A bad rash all over your body  . Dizziness and weakness    Immunizations Administered    Name Date Dose VIS Date Route   Pfizer COVID-19 Vaccine 10/04/2019 11:31 AM 0.3 mL 07/30/2019 Intramuscular   Manufacturer: [place]   Lot: [id number]   [place]: [id number]

## 2020-01-19 DIAGNOSIS — H52203 Unspecified astigmatism, bilateral: Secondary | ICD-10-CM | POA: Diagnosis not present

## 2020-01-19 DIAGNOSIS — H04123 Dry eye syndrome of bilateral lacrimal glands: Secondary | ICD-10-CM | POA: Diagnosis not present

## 2020-01-19 DIAGNOSIS — H25813 Combined forms of age-related cataract, bilateral: Secondary | ICD-10-CM | POA: Diagnosis not present

## 2020-01-19 DIAGNOSIS — H43813 Vitreous degeneration, bilateral: Secondary | ICD-10-CM | POA: Diagnosis not present

## 2020-01-27 DIAGNOSIS — E7800 Pure hypercholesterolemia, unspecified: Secondary | ICD-10-CM | POA: Diagnosis not present

## 2020-01-27 DIAGNOSIS — I10 Essential (primary) hypertension: Secondary | ICD-10-CM | POA: Diagnosis not present

## 2020-01-31 ENCOUNTER — Other Ambulatory Visit: Payer: Self-pay | Admitting: Family Medicine

## 2020-01-31 DIAGNOSIS — Z1231 Encounter for screening mammogram for malignant neoplasm of breast: Secondary | ICD-10-CM

## 2020-03-06 ENCOUNTER — Ambulatory Visit: Payer: PPO

## 2020-03-09 ENCOUNTER — Ambulatory Visit
Admission: RE | Admit: 2020-03-09 | Discharge: 2020-03-09 | Disposition: A | Discharge status: Home / Self Care | Payer: PPO | Source: Ambulatory Visit | Attending: Family Medicine | Admitting: Family Medicine

## 2020-03-09 ENCOUNTER — Other Ambulatory Visit: Payer: Self-pay

## 2020-03-09 DIAGNOSIS — Z1231 Encounter for screening mammogram for malignant neoplasm of breast: Secondary | ICD-10-CM | POA: Diagnosis not present

## 2020-06-14 DIAGNOSIS — Z23 Encounter for immunization: Secondary | ICD-10-CM | POA: Diagnosis not present

## 2020-07-07 DIAGNOSIS — L723 Sebaceous cyst: Secondary | ICD-10-CM | POA: Diagnosis not present

## 2020-07-07 DIAGNOSIS — D1801 Hemangioma of skin and subcutaneous tissue: Secondary | ICD-10-CM | POA: Diagnosis not present

## 2020-07-07 DIAGNOSIS — L570 Actinic keratosis: Secondary | ICD-10-CM | POA: Diagnosis not present

## 2020-07-07 DIAGNOSIS — L720 Epidermal cyst: Secondary | ICD-10-CM | POA: Diagnosis not present

## 2020-07-07 DIAGNOSIS — L814 Other melanin hyperpigmentation: Secondary | ICD-10-CM | POA: Diagnosis not present

## 2020-07-07 DIAGNOSIS — L819 Disorder of pigmentation, unspecified: Secondary | ICD-10-CM | POA: Diagnosis not present

## 2020-07-07 DIAGNOSIS — Z85828 Personal history of other malignant neoplasm of skin: Secondary | ICD-10-CM | POA: Diagnosis not present

## 2020-07-07 DIAGNOSIS — L821 Other seborrheic keratosis: Secondary | ICD-10-CM | POA: Diagnosis not present

## 2020-07-07 DIAGNOSIS — L738 Other specified follicular disorders: Secondary | ICD-10-CM | POA: Diagnosis not present

## 2020-07-22 IMAGING — MG DIGITAL SCREENING BILAT W/ TOMO W/ CAD
8 series · 9 of 24 positions shown · non-contrast
Comparison: Previous exam(s).

CLINICAL DATA: Screening.

EXAM:
DIGITAL SCREENING BILATERAL MAMMOGRAM WITH TOMO AND CAD

[R MLO synth-2D]
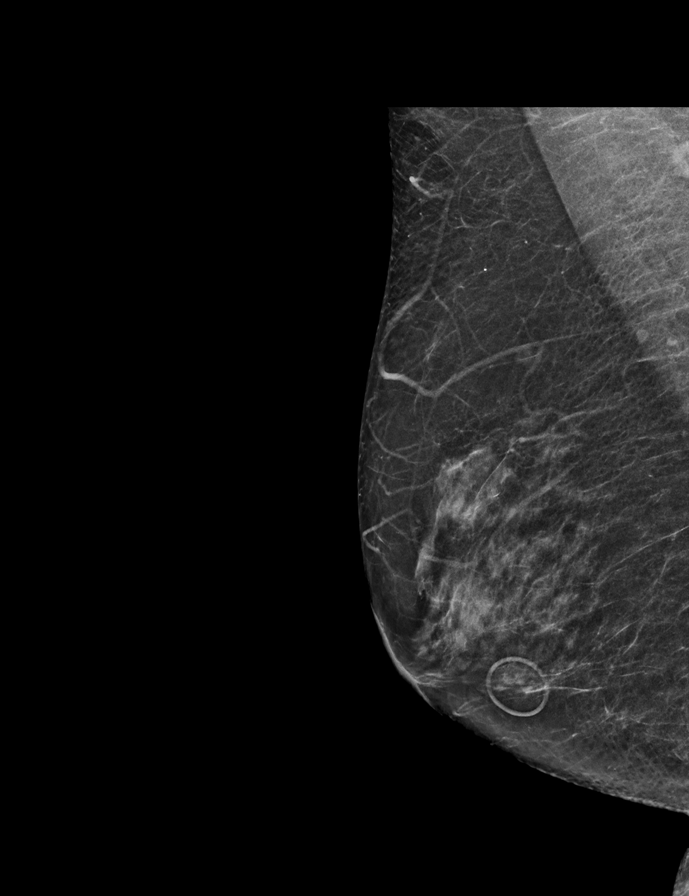

[L MLO synth-2D]
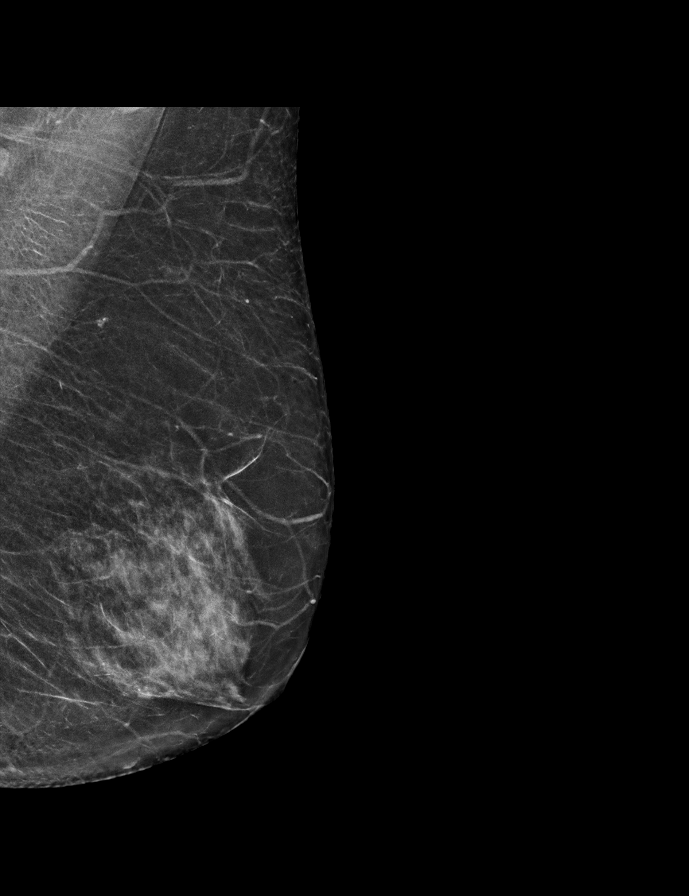

[L CC synth-2D]
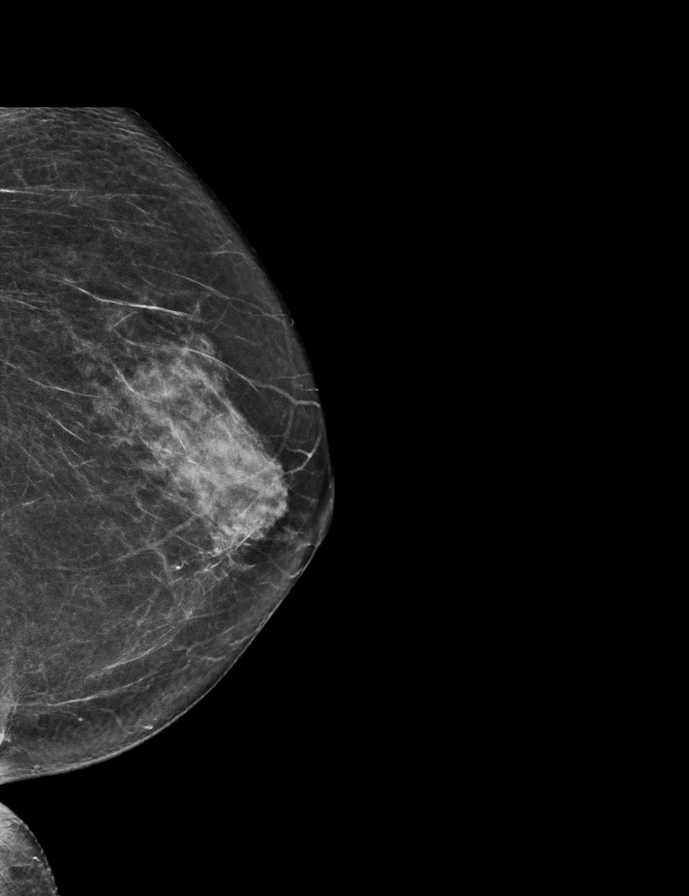

[R CC synth-2D]
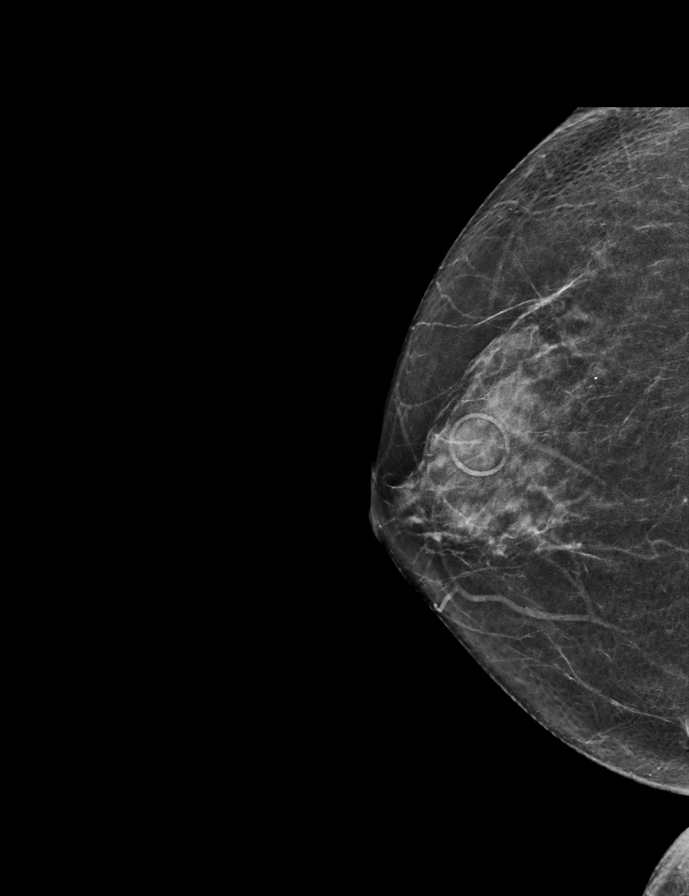

[L CC tomo · 2 of 62 frames shown]
[frame 21/62]
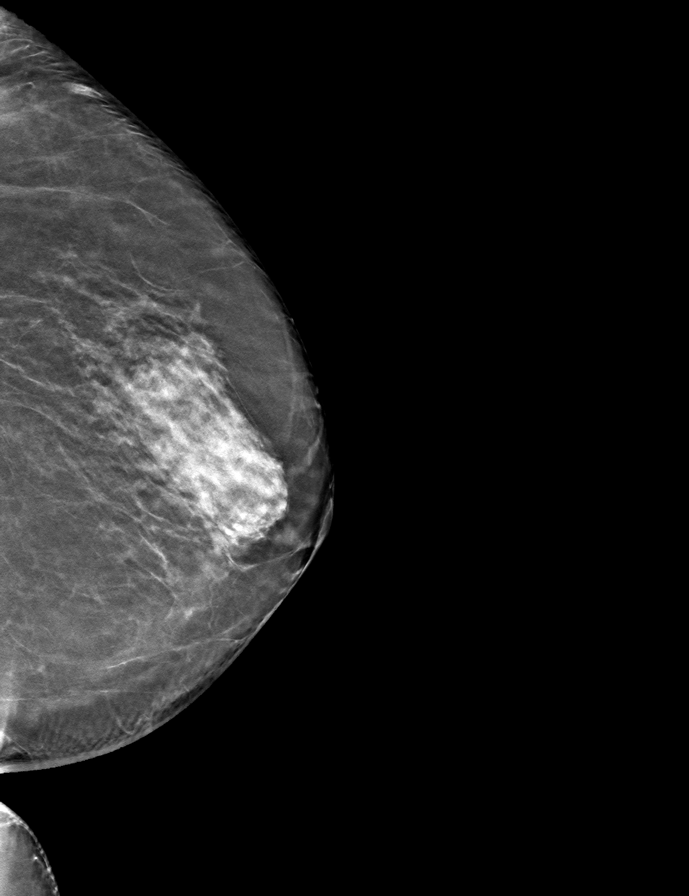
[frame 31/62]
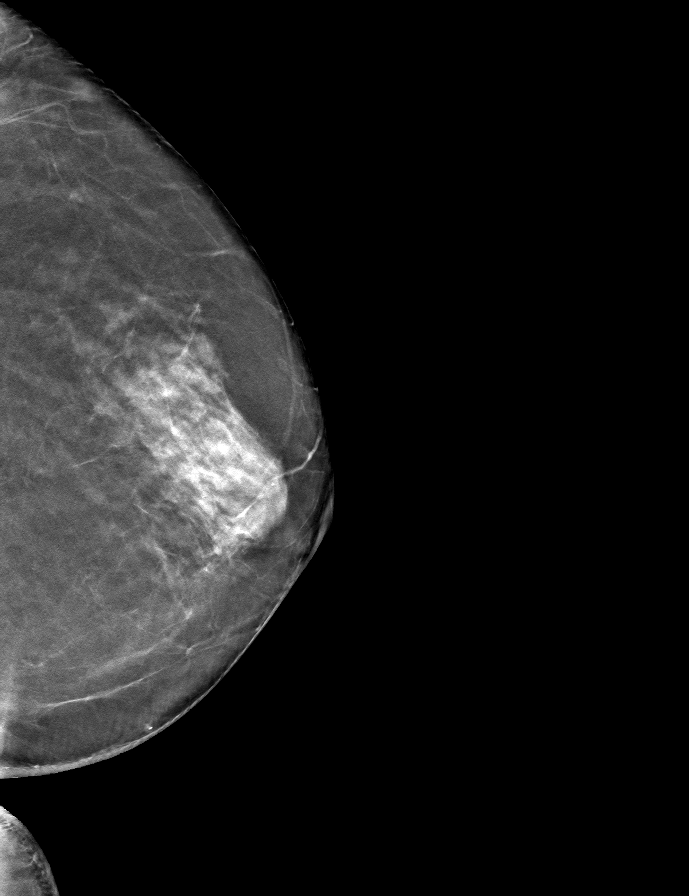

[R MLO tomo · tomo slice 29/57.0]
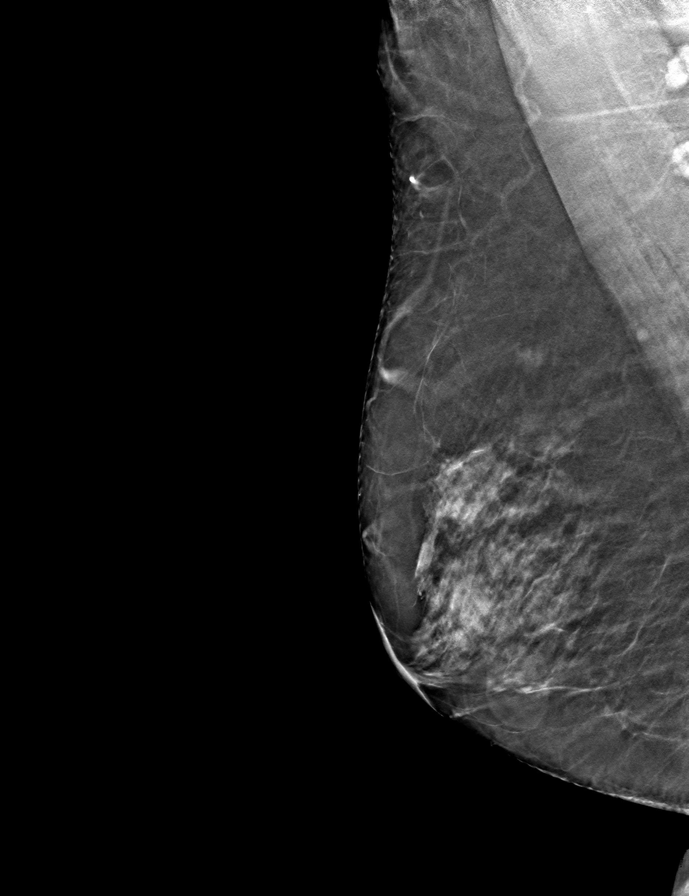

[R CC tomo · tomo slice 31/60.0]
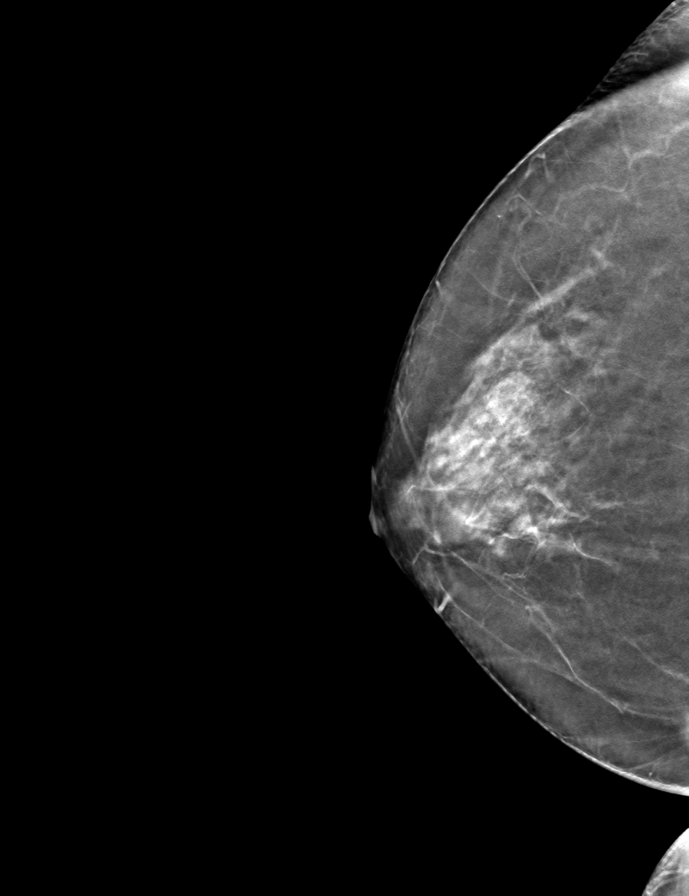

[L MLO tomo · tomo slice 32/63.0]
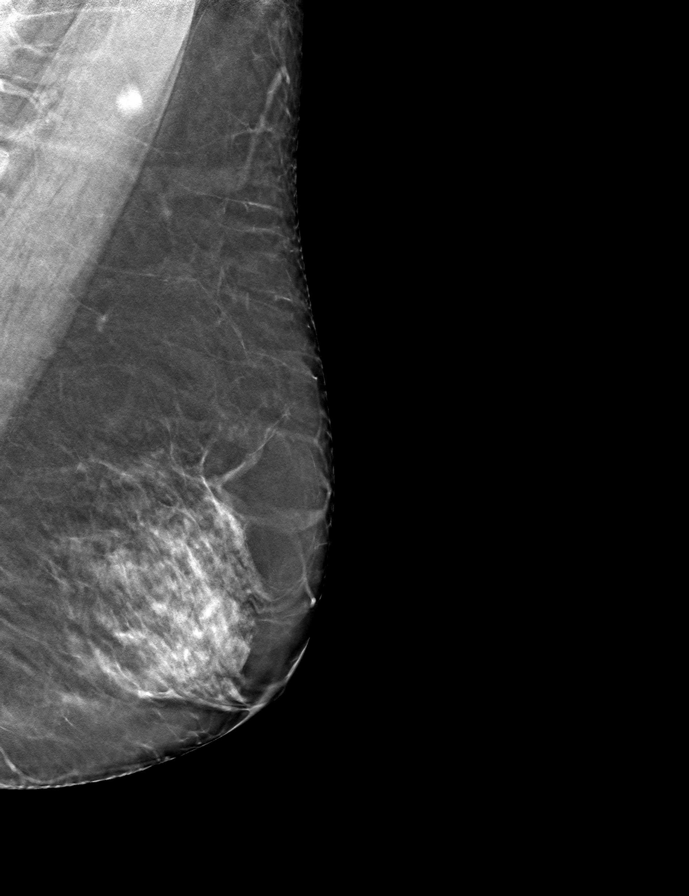

[9 of 24 positions shown; findings below may reference images not displayed]

ACR Breast Density Category c: The breast tissue is heterogeneously
dense, which may obscure small masses.
FINDINGS: There are no findings suspicious for malignancy. Images were
processed with CAD.
IMPRESSION: No mammographic evidence of malignancy. A result letter of this
screening mammogram will be mailed directly to the patient.

RECOMMENDATION:
Screening mammogram in one year. (Code:[id number]-[id number])

BI-RADS CATEGORY  1: Negative.

## 2020-07-31 DIAGNOSIS — Z0000 Encounter for general adult medical examination without abnormal findings: Secondary | ICD-10-CM | POA: Diagnosis not present

## 2020-07-31 DIAGNOSIS — I10 Essential (primary) hypertension: Secondary | ICD-10-CM | POA: Diagnosis not present

## 2020-07-31 DIAGNOSIS — Z1389 Encounter for screening for other disorder: Secondary | ICD-10-CM | POA: Diagnosis not present

## 2020-07-31 DIAGNOSIS — E7800 Pure hypercholesterolemia, unspecified: Secondary | ICD-10-CM | POA: Diagnosis not present

## 2020-11-27 DIAGNOSIS — Z85828 Personal history of other malignant neoplasm of skin: Secondary | ICD-10-CM | POA: Diagnosis not present

## 2020-11-27 DIAGNOSIS — L720 Epidermal cyst: Secondary | ICD-10-CM | POA: Diagnosis not present

## 2021-01-29 ENCOUNTER — Other Ambulatory Visit: Payer: Self-pay | Admitting: Family Medicine

## 2021-01-29 DIAGNOSIS — I10 Essential (primary) hypertension: Secondary | ICD-10-CM | POA: Diagnosis not present

## 2021-01-29 DIAGNOSIS — E7800 Pure hypercholesterolemia, unspecified: Secondary | ICD-10-CM | POA: Diagnosis not present

## 2021-01-29 DIAGNOSIS — M79675 Pain in left toe(s): Secondary | ICD-10-CM | POA: Diagnosis not present

## 2021-01-29 DIAGNOSIS — Z1231 Encounter for screening mammogram for malignant neoplasm of breast: Secondary | ICD-10-CM

## 2021-03-06 DIAGNOSIS — H43813 Vitreous degeneration, bilateral: Secondary | ICD-10-CM | POA: Diagnosis not present

## 2021-03-06 DIAGNOSIS — H2513 Age-related nuclear cataract, bilateral: Secondary | ICD-10-CM | POA: Diagnosis not present

## 2021-03-06 DIAGNOSIS — H04123 Dry eye syndrome of bilateral lacrimal glands: Secondary | ICD-10-CM | POA: Diagnosis not present

## 2021-03-06 DIAGNOSIS — H524 Presbyopia: Secondary | ICD-10-CM | POA: Diagnosis not present

## 2021-03-23 ENCOUNTER — Ambulatory Visit
Admission: RE | Admit: 2021-03-23 | Discharge: 2021-03-23 | Disposition: A | Discharge status: Home / Self Care | Payer: PPO | Source: Ambulatory Visit | Attending: Family Medicine | Admitting: Family Medicine

## 2021-03-23 ENCOUNTER — Other Ambulatory Visit: Payer: Self-pay

## 2021-03-23 DIAGNOSIS — Z1231 Encounter for screening mammogram for malignant neoplasm of breast: Secondary | ICD-10-CM | POA: Diagnosis not present

## 2021-06-13 DIAGNOSIS — Z23 Encounter for immunization: Secondary | ICD-10-CM | POA: Diagnosis not present

## 2021-07-26 DIAGNOSIS — L821 Other seborrheic keratosis: Secondary | ICD-10-CM | POA: Diagnosis not present

## 2021-07-26 DIAGNOSIS — L820 Inflamed seborrheic keratosis: Secondary | ICD-10-CM | POA: Diagnosis not present

## 2021-07-26 DIAGNOSIS — L308 Other specified dermatitis: Secondary | ICD-10-CM | POA: Diagnosis not present

## 2021-07-26 DIAGNOSIS — L720 Epidermal cyst: Secondary | ICD-10-CM | POA: Diagnosis not present

## 2021-07-26 DIAGNOSIS — L853 Xerosis cutis: Secondary | ICD-10-CM | POA: Diagnosis not present

## 2021-07-26 DIAGNOSIS — D2271 Melanocytic nevi of right lower limb, including hip: Secondary | ICD-10-CM | POA: Diagnosis not present

## 2021-07-26 DIAGNOSIS — L814 Other melanin hyperpigmentation: Secondary | ICD-10-CM | POA: Diagnosis not present

## 2021-07-26 DIAGNOSIS — Z85828 Personal history of other malignant neoplasm of skin: Secondary | ICD-10-CM | POA: Diagnosis not present

## 2021-07-26 DIAGNOSIS — D1801 Hemangioma of skin and subcutaneous tissue: Secondary | ICD-10-CM | POA: Diagnosis not present

## 2021-08-04 DIAGNOSIS — U071 COVID-19: Secondary | ICD-10-CM | POA: Diagnosis not present

## 2021-09-05 DIAGNOSIS — Z0000 Encounter for general adult medical examination without abnormal findings: Secondary | ICD-10-CM | POA: Diagnosis not present

## 2021-09-05 DIAGNOSIS — E7800 Pure hypercholesterolemia, unspecified: Secondary | ICD-10-CM | POA: Diagnosis not present

## 2021-09-05 DIAGNOSIS — I10 Essential (primary) hypertension: Secondary | ICD-10-CM | POA: Diagnosis not present

## 2021-09-05 DIAGNOSIS — R739 Hyperglycemia, unspecified: Secondary | ICD-10-CM | POA: Diagnosis not present

## 2021-09-05 DIAGNOSIS — Z1389 Encounter for screening for other disorder: Secondary | ICD-10-CM | POA: Diagnosis not present

## 2021-09-05 DIAGNOSIS — M8580 Other specified disorders of bone density and structure, unspecified site: Secondary | ICD-10-CM | POA: Diagnosis not present

## 2022-02-08 ENCOUNTER — Other Ambulatory Visit: Payer: Self-pay | Admitting: Family Medicine

## 2022-02-08 DIAGNOSIS — Z1231 Encounter for screening mammogram for malignant neoplasm of breast: Secondary | ICD-10-CM

## 2022-03-05 DIAGNOSIS — I10 Essential (primary) hypertension: Secondary | ICD-10-CM | POA: Diagnosis not present

## 2022-03-05 DIAGNOSIS — E7800 Pure hypercholesterolemia, unspecified: Secondary | ICD-10-CM | POA: Diagnosis not present

## 2022-03-05 DIAGNOSIS — N1831 Chronic kidney disease, stage 3a: Secondary | ICD-10-CM | POA: Diagnosis not present

## 2022-03-05 DIAGNOSIS — R7303 Prediabetes: Secondary | ICD-10-CM | POA: Diagnosis not present

## 2022-03-25 ENCOUNTER — Ambulatory Visit
Admission: RE | Admit: 2022-03-25 | Discharge: 2022-03-25 | Disposition: A | Discharge status: Home / Self Care | Payer: PPO | Source: Ambulatory Visit | Attending: Family Medicine | Admitting: Family Medicine

## 2022-03-25 DIAGNOSIS — Z1231 Encounter for screening mammogram for malignant neoplasm of breast: Secondary | ICD-10-CM | POA: Diagnosis not present

## 2022-03-27 DIAGNOSIS — H2513 Age-related nuclear cataract, bilateral: Secondary | ICD-10-CM | POA: Diagnosis not present

## 2022-03-27 DIAGNOSIS — H4313 Vitreous hemorrhage, bilateral: Secondary | ICD-10-CM | POA: Diagnosis not present

## 2022-03-27 DIAGNOSIS — H524 Presbyopia: Secondary | ICD-10-CM | POA: Diagnosis not present

## 2022-03-27 DIAGNOSIS — H04123 Dry eye syndrome of bilateral lacrimal glands: Secondary | ICD-10-CM | POA: Diagnosis not present

## 2022-04-01 DIAGNOSIS — Z85828 Personal history of other malignant neoplasm of skin: Secondary | ICD-10-CM | POA: Diagnosis not present

## 2022-04-01 DIAGNOSIS — L814 Other melanin hyperpigmentation: Secondary | ICD-10-CM | POA: Diagnosis not present

## 2022-07-03 DIAGNOSIS — Z23 Encounter for immunization: Secondary | ICD-10-CM | POA: Diagnosis not present

## 2022-08-07 DIAGNOSIS — M25551 Pain in right hip: Secondary | ICD-10-CM | POA: Diagnosis not present

## 2022-08-21 DIAGNOSIS — L821 Other seborrheic keratosis: Secondary | ICD-10-CM | POA: Diagnosis not present

## 2022-08-21 DIAGNOSIS — D224 Melanocytic nevi of scalp and neck: Secondary | ICD-10-CM | POA: Diagnosis not present

## 2022-08-21 DIAGNOSIS — D2239 Melanocytic nevi of other parts of face: Secondary | ICD-10-CM | POA: Diagnosis not present

## 2022-08-21 DIAGNOSIS — L820 Inflamed seborrheic keratosis: Secondary | ICD-10-CM | POA: Diagnosis not present

## 2022-08-21 DIAGNOSIS — D1801 Hemangioma of skin and subcutaneous tissue: Secondary | ICD-10-CM | POA: Diagnosis not present

## 2022-08-21 DIAGNOSIS — L814 Other melanin hyperpigmentation: Secondary | ICD-10-CM | POA: Diagnosis not present

## 2022-08-21 DIAGNOSIS — L308 Other specified dermatitis: Secondary | ICD-10-CM | POA: Diagnosis not present

## 2022-08-21 DIAGNOSIS — Z85828 Personal history of other malignant neoplasm of skin: Secondary | ICD-10-CM | POA: Diagnosis not present

## 2022-08-27 DIAGNOSIS — I10 Essential (primary) hypertension: Secondary | ICD-10-CM | POA: Diagnosis not present

## 2022-08-27 DIAGNOSIS — E7800 Pure hypercholesterolemia, unspecified: Secondary | ICD-10-CM | POA: Diagnosis not present

## 2022-08-27 DIAGNOSIS — R7303 Prediabetes: Secondary | ICD-10-CM | POA: Diagnosis not present

## 2022-08-29 DIAGNOSIS — D649 Anemia, unspecified: Secondary | ICD-10-CM | POA: Diagnosis not present

## 2022-08-29 DIAGNOSIS — N1831 Chronic kidney disease, stage 3a: Secondary | ICD-10-CM | POA: Diagnosis not present

## 2022-08-29 DIAGNOSIS — R7303 Prediabetes: Secondary | ICD-10-CM | POA: Diagnosis not present

## 2022-08-29 DIAGNOSIS — Z1331 Encounter for screening for depression: Secondary | ICD-10-CM | POA: Diagnosis not present

## 2022-08-29 DIAGNOSIS — I10 Essential (primary) hypertension: Secondary | ICD-10-CM | POA: Diagnosis not present

## 2022-08-29 DIAGNOSIS — Z23 Encounter for immunization: Secondary | ICD-10-CM | POA: Diagnosis not present

## 2022-08-29 DIAGNOSIS — E7800 Pure hypercholesterolemia, unspecified: Secondary | ICD-10-CM | POA: Diagnosis not present

## 2022-08-29 DIAGNOSIS — Z0000 Encounter for general adult medical examination without abnormal findings: Secondary | ICD-10-CM | POA: Diagnosis not present

## 2022-10-21 DIAGNOSIS — D649 Anemia, unspecified: Secondary | ICD-10-CM | POA: Diagnosis not present

## 2022-11-14 DIAGNOSIS — D649 Anemia, unspecified: Secondary | ICD-10-CM | POA: Diagnosis not present

## 2022-12-25 ENCOUNTER — Telehealth: Payer: Self-pay | Admitting: Internal Medicine

## 2022-12-25 NOTE — Telephone Encounter (Signed)
Inbound call from patient, states she is having some changes in her bowel habits and unexplained weight loss. Patient is currently scheduled for 5/31 but would like to speak to a nurse for a sooner appointment.

## 2022-12-27 NOTE — Telephone Encounter (Signed)
Spoke with patient & she stated she has noticed a change in her bowel movements, and has lost 5 lbs since January unintentionally. On Wednesday, she said she had 3 bowel movements that were "normal, then a blob, then liquid."  Denies any abdominal pain. PCP sent in general referral for mild anemia & 3 guiaiac stool cards. She was given soonest appointment with APP on 01/17/23. Advised patient that I have her on my list to contact if a cancellation becomes available & if new symptoms develop to contact PCP office for sooner appointment in the meantime. Pt verbalized all understanding.

## 2022-12-31 ENCOUNTER — Telehealth: Payer: Self-pay

## 2022-12-31 NOTE — Telephone Encounter (Signed)
Pt was rescheduled for a sooner appointment: Pt scheduled for 01/07/2023 at 10:30 AM with Amy Esterwood. PA. Pt made aware. Pt verbalized understanding with all questions answered.

## 2023-01-07 ENCOUNTER — Other Ambulatory Visit (INDEPENDENT_AMBULATORY_CARE_PROVIDER_SITE_OTHER): Payer: PPO

## 2023-01-07 ENCOUNTER — Encounter: Payer: Self-pay | Admitting: Physician Assistant

## 2023-01-07 ENCOUNTER — Ambulatory Visit: Payer: PPO | Admitting: Physician Assistant

## 2023-01-07 VITALS — BP 130/70 | HR 72 | Ht <= 58 in | Wt 123.0 lb

## 2023-01-07 DIAGNOSIS — D649 Anemia, unspecified: Secondary | ICD-10-CM

## 2023-01-07 DIAGNOSIS — R195 Other fecal abnormalities: Secondary | ICD-10-CM

## 2023-01-07 DIAGNOSIS — Z86010 Personal history of colonic polyps: Secondary | ICD-10-CM | POA: Diagnosis not present

## 2023-01-07 DIAGNOSIS — R634 Abnormal weight loss: Secondary | ICD-10-CM

## 2023-01-07 LAB — CBC WITH DIFFERENTIAL/PLATELET
Basophils Absolute: 0 10*3/uL (ref 0.0–0.1)
Basophils Relative: 0.8 % (ref 0.0–3.0)
Eosinophils Absolute: 0 10*3/uL (ref 0.0–0.7)
Eosinophils Relative: 0.6 % (ref 0.0–5.0)
HCT: 37.5 % (ref 36.0–46.0)
Hemoglobin: 12.3 g/dL (ref 12.0–15.0)
Lymphocytes Relative: 14.6 % (ref 12.0–46.0)
Lymphs Abs: 0.9 10*3/uL (ref 0.7–4.0)
MCHC: 32.9 g/dL (ref 30.0–36.0)
MCV: 92.5 fl (ref 78.0–100.0)
Monocytes Absolute: 0.6 10*3/uL (ref 0.1–1.0)
Monocytes Relative: 9.8 % (ref 3.0–12.0)
Neutro Abs: 4.8 10*3/uL (ref 1.4–7.7)
Neutrophils Relative %: 74.2 % (ref 43.0–77.0)
Platelets: 236 10*3/uL (ref 150.0–400.0)
RBC: 4.05 Mil/uL (ref 3.87–5.11)
RDW: 12.5 % (ref 11.5–15.5)
WBC: 6.4 10*3/uL (ref 4.0–10.5)

## 2023-01-07 LAB — IBC + FERRITIN
Ferritin: 76.5 ng/mL (ref 10.0–291.0)
Iron: 98 ug/dL (ref 42–145)
Saturation Ratios: 28.8 % (ref 20.0–50.0)
TIBC: 340.2 ug/dL (ref 250.0–450.0)
Transferrin: 243 mg/dL (ref 212.0–360.0)

## 2023-01-07 NOTE — Patient Instructions (Addendum)
_______________________________________________________  If your blood pressure at your visit was 140/90 or greater, please contact your primary care physician to follow up on this.  _______________________________________________________  If you are age 77 or older, your body mass index should be between 23-30. Your Body mass index is 26.62 kg/m. If this is out of the aforementioned range listed, please consider follow up with your Primary Care Provider.  If you are age 1 or younger, your body mass index should be between 19-25. Your Body mass index is 26.62 kg/m. If this is out of the aformentioned range listed, please consider follow up with your Primary Care Provider.  ________________________________________________________  The [facility] providers would like to encourage you to use [facility] to communicate with providers for non-urgent requests or questions.  Due to [place] hold times on the telephone, sending your provider a message by [facility] may be a faster and more efficient way to get a response.  Please allow 48 business hours for a response.  Please remember that this is for non-urgent requests.  _______________________________________________________  Your provider has requested that you go to the basement level for lab work before leaving today. Press "B" on the elevator. The lab is located at the first door on the left as you exit the elevator.  You have been scheduled for an endoscopy and colonoscopy. Please follow the written instructions given to you at your visit today. Please pick up your prep supplies at the pharmacy within the next 1-3 days. If you use inhalers (even only as needed), please bring them with you on the day of your procedure.  Due to recent changes in healthcare laws, you may see the results of your imaging and laboratory studies on MyChart before your provider has had a chance to review them.  We understand that in some cases there may be results that are  confusing or concerning to you. Not all laboratory results come back in the same time frame and the provider may be waiting for multiple results in order to interpret others.  Please give [place] 48 hours in order for your provider to thoroughly review all the results before contacting the office for clarification of your results.   Thank you for entrusting me with your care and choosing [facility].  Amy Esterwood, PA-C

## 2023-01-07 NOTE — Progress Notes (Signed)
Subjective:    Patient ID: Katherine Chang, female    DOB: [birth year], 77 y.o.   MRN: [id number]  HPI Katherine Chang is a pleasant 77 year old white female, established with Dr. Leone Payor.  She is referred back today by Dr. Merri Brunette for evaluation of new finding of mild anemia, and Hemoccult positive stool. Patient had recent labs done in March 2024 with finding of WBC 6.3/hemoglobin 11.5/hematocrit 35.4/MCV 92.6 and platelets 224. In January 2024 hemoglobin was 11.7, and January 2023 hemoglobin 12.4.  Patient subsequently did Hemoccult cards which were positive x 3. TSH 1.4 22 August 2022 She has not noted any melena or hematochezia.  She generally feels well, appetite has been fine.  She denies any nausea, no regular heartburn or indigestion symptoms.  She has gradually lost about 10 pounds over the past year. No regular aspirin or NSAIDs. She has not really noticed any recent changes in her bowel habits.  She says over the past couple of years bowel habits have alternated between normal bowel movements, semiformed stools and sometimes narrower stools.  She does have history of adenomatous and sessile serrated polyps, last colonoscopy November 2019 with multiple diverticuli and 2 sessile polyps removed, 1 of which was a tubular adenoma and 1 a sessile serrated polyp.  These were diminutive.  Indicated for 5-year interval follow-up though slated as optional due to age. She has not had prior EGD. Generally in good health with history of hypertension and hyperlipidemia, and osteopenia.  Review of Systems Pertinent positive and negative review of systems were noted in the above HPI section.  All other review of systems was otherwise negative.   Outpatient Encounter Medications as of 01/07/2023  Medication Sig   atenolol (TENORMIN) 50 MG tablet Take 50 mg by mouth daily.   lisinopril (ZESTRIL) 20 MG tablet Take 20 mg by mouth daily.   simvastatin (ZOCOR) 20 MG tablet Take 20 mg by mouth daily.    [DISCONTINUED] atenolol (TENORMIN) 100 MG tablet Take 50 mg by mouth daily.   [DISCONTINUED] lisinopril (PRINIVIL,ZESTRIL) 10 MG tablet Take 1 tablet by mouth daily.   [DISCONTINUED] simvastatin (ZOCOR) 10 MG tablet Take 10 mg by mouth at bedtime.   Facility-Administered Encounter Medications as of 01/07/2023  Medication   0.9 %  sodium chloride infusion   No Known Allergies Patient Active Problem List   Diagnosis Date Noted   Personal history of colonic polyps 11/20/2012   Social History   Socioeconomic History   Marital status: Single    Spouse name: Not on file   Number of children: 2   Years of education: Not on file   Highest education level: Not on file  Occupational History   Occupation: retired  Tobacco Use   Smoking status: Never   Smokeless tobacco: Never  Vaping Use   Vaping Use: Never used  Substance and Sexual Activity   Alcohol use: Yes    Comment: occ- wine   Drug use: No   Sexual activity: Not on file  Other Topics Concern   Not on file  Social History Narrative   Single      Son is Financial planner   Social Determinants of Health   Financial Resource Strain: Not on file  Food Insecurity: Not on file  Transportation Needs: Not on file  Physical Activity: Not on file  Stress: Not on file  Social Connections: Not on file  Intimate Partner Violence: Not on file    Ms. Spradlin's family history includes Breast  cancer in her paternal aunt; Colon cancer in her maternal grandmother; Diabetes in her mother; Glaucoma in her father; Hyperlipidemia in her brother, mother, and son; Hypertension in her brother, father, mother, and son; Lung cancer in her maternal grandfather; Migraines in her mother; Stomach cancer in her maternal grandmother; Stroke in her paternal grandmother; Thyroid disease in her mother.      Objective:    Vitals:   01/07/23 1018  BP: 130/70  Pulse: 72    Physical Exam Well-developed well-nourished f elderly white female in no acute  distress.  Very pleasant, accompanied by her daughter height, Weight, 123 BMI 26.6  HEENT; nontraumatic normocephalic, EOMI, PE R LA, sclera anicteric. Oropharynx; not examined today Neck; supple, no JVD Cardiovascular; regular rate and rhythm with S1-S2, no murmur rub or gallop Pulmonary; Clear bilaterally Abdomen; soft, nontender, nondistended, no palpable mass or hepatosplenomegaly, bowel sounds are active Rectal; not done today recently documented heme positive Skin; benign exam, no jaundice rash or appreciable lesions Extremities; no clubbing cyanosis or edema skin warm and dry Neuro/Psych; alert and oriented x4, grossly nonfocal mood and affect appropriate        Assessment & Plan:   #60 77 year old white female with new mild normocytic anemia and Hemoccult positive stool.  Mild anemia found at routine labs per PCP.  Etiology is not clear, patient does have history of adenomatous and sessile serrated polyps, last colonoscopy November 2019 with 2 small polyps removed and indicated for 5-year interval follow-up. She has not had prior EGD. She is asymptomatic at present, no upper GI symptoms or recent changes in bowel habits, bowels have alternated over the past few years.  No history of melena or hematochezia.  Rule out upper versus lower sources for occult GI blood loss, chronic gastropathy, AVMs, occult neoplasm,  #2 weight loss 10 pounds over 1 year Recent TSH within normal limits  #3 hypertension #4.  Hyperlipidemia  Plan Her last labs were done in March will repeat CBC today, and check iron studies. Patient will be scheduled for upper endoscopy and colonoscopy with Dr. Leone Payor.  Both procedures were discussed in detail with the patient including indications risk and benefits and she is agreeable to proceed. Further recommendation pending results of above.    Sammuel Cooper PA-C 01/07/2023   Cc: Merri Brunette, MD

## 2023-01-17 ENCOUNTER — Ambulatory Visit: Payer: PPO | Admitting: Nurse Practitioner

## 2023-01-21 ENCOUNTER — Telehealth: Payer: Self-pay | Admitting: Physician Assistant

## 2023-01-21 NOTE — Telephone Encounter (Signed)
PT is scheduled for an EGD/colonoscopy on 6/26. PT is requesting a call back to find out if her insurance company will cover the procedure. Please advise.

## 2023-01-27 ENCOUNTER — Encounter: Payer: Self-pay | Admitting: Internal Medicine

## 2023-02-06 ENCOUNTER — Encounter: Payer: Self-pay | Admitting: Certified Registered Nurse Anesthetist

## 2023-02-12 ENCOUNTER — Ambulatory Visit: Payer: PPO | Admitting: Internal Medicine

## 2023-02-12 ENCOUNTER — Encounter: Payer: Self-pay | Admitting: Internal Medicine

## 2023-02-12 ENCOUNTER — Telehealth: Payer: Self-pay | Admitting: Physician Assistant

## 2023-02-12 VITALS — BP 105/66 | HR 71 | Temp 97.3°F | Resp 12 | Ht <= 58 in | Wt 123.0 lb

## 2023-02-12 DIAGNOSIS — Z1211 Encounter for screening for malignant neoplasm of colon: Secondary | ICD-10-CM

## 2023-02-12 DIAGNOSIS — R634 Abnormal weight loss: Secondary | ICD-10-CM | POA: Diagnosis not present

## 2023-02-12 DIAGNOSIS — E785 Hyperlipidemia, unspecified: Secondary | ICD-10-CM | POA: Diagnosis not present

## 2023-02-12 DIAGNOSIS — D649 Anemia, unspecified: Secondary | ICD-10-CM

## 2023-02-12 DIAGNOSIS — I10 Essential (primary) hypertension: Secondary | ICD-10-CM | POA: Diagnosis not present

## 2023-02-12 DIAGNOSIS — R195 Other fecal abnormalities: Secondary | ICD-10-CM

## 2023-02-12 MED ORDER — SODIUM CHLORIDE 0.9 % IV SOLN
500.0000 mL | Freq: Once | INTRAVENOUS | Status: DC
Start: 2023-02-12 — End: 2023-02-12

## 2023-02-12 NOTE — Op Note (Signed)
[facility] Patient Name: Katherine Chang Procedure Date: 02/12/2023 12:49 PM MRN: [id number] Endoscopist: Iva Boop , MD, [id number] Age: 77 Referring MD:  Date of Birth: 08-17-1946 Gender: Female Account #: [id number] Procedure:                Upper GI endoscopy Indications:              Heme positive stool, Anemia Medicines:                Monitored Anesthesia Care Procedure:                Pre-Anesthesia Assessment:                           - Prior to the procedure, a History and Physical                            was performed, and patient medications and                            allergies were reviewed. The patient's tolerance of                            previous anesthesia was also reviewed. The risks                            and benefits of the procedure and the sedation                            options and risks were discussed with the patient.                            All questions were answered, and informed consent                            was obtained. Prior Anticoagulants: The patient has                            taken no anticoagulant or antiplatelet agents. ASA                            Grade Assessment: II - A patient with mild systemic                            disease. After reviewing the risks and benefits,                            the patient was deemed in satisfactory condition to                            undergo the procedure.                           After obtaining informed consent, the endoscope was  passed under direct vision. Throughout the                            procedure, the patient's blood pressure, pulse, and                            oxygen saturations were monitored continuously. The                            Olympus Scope [id number] was introduced through the                            mouth, and advanced to the second part of duodenum.                            The upper GI endoscopy  was accomplished without                            difficulty. The patient tolerated the procedure                            well. Scope In: Scope Out: Findings:                 The esophagus was normal.                           The stomach was normal.                           The examined duodenum was normal.                           The cardia and gastric fundus were normal on                            retroflexion. Complications:            No immediate complications. Estimated Blood Loss:     Estimated blood loss: none. Impression:               - Normal esophagus.                           - Normal stomach.                           - Normal examined duodenum.                           - No specimens collected. Recommendation:           - Patient has a contact number available for                            emergencies. The signs and symptoms of potential  delayed complications were discussed with the                            patient. Return to normal activities tomorrow.                            Written discharge instructions were provided to the                            patient.                           - Resume previous diet.                           - Continue present medications.                           - See the other procedure note for documentation of                            additional recommendations. Iva Boop, MD 02/12/2023 1:43:37 PM This report has been signed electronically.

## 2023-02-12 NOTE — Progress Notes (Signed)
Report given to PACU, vss 

## 2023-02-12 NOTE — Telephone Encounter (Signed)
Spoke with pt and her son. They report her BP has been running about 160/80 last night and today. Pt took both of her BP medications last night as she normally does. RN explained that her BP reading was not too high and that the gatorade she drank for the bowel prep has a lot of sodium in it and can make the BP run a little higher than normal. States she normally runs 140's systolic. Pt denies chest pain or dizziness. RN instructed pt that her BP was not too high and that we will plan to proceed with the procedure and that we will check her BP on check-in.

## 2023-02-12 NOTE — Progress Notes (Signed)
[facility] History and Physical   Primary Care Physician:  Merri Brunette, MD   Reason for Procedure:   Anemia, heme +, weigt loss  Plan:    EGD and colonoscopy     HPI: Katherine Chang is a 77 y.o. female w/ following issues   #79 77 year old white female with new mild normocytic anemia and Hemoccult positive stool.  Mild anemia found at routine labs per PCP.   Etiology is not clear, patient does have history of adenomatous and sessile serrated polyps, last colonoscopy November 2019 with 2 small polyps removed and indicated for 5-year interval follow-up. She has not had prior EGD. She is asymptomatic at present, no upper GI symptoms or recent changes in bowel habits, bowels have alternated over the past few years.  No history of melena or hematochezia.   Rule out upper versus lower sources for occult GI blood loss, chronic gastropathy, AVMs, occult neoplasm,   #2 weight loss 10 pounds over 1 year Recent TSH within normal limits   #3 hypertension #4.  Hyperlipidemia  Wt Readings from Last 3 Encounters:  02/12/23 123 lb (55.8 kg)  01/07/23 123 lb (55.8 kg)  07/06/18 137 lb (62.1 kg)      Latest Ref Rng & Units 01/07/2023   11:26 AM  CBC  WBC 4.0 - 10.5 K/uL 6.4   Hemoglobin 12.0 - 15.0 g/dL 82.9   Hematocrit 56.2 - 46.0 % 37.5   Platelets 150.0 - 400.0 K/uL 236.0    Lab Results  Component Value Date   IRON 98 01/07/2023   TIBC 340.2 01/07/2023   FERRITIN 76.5 01/07/2023    Past Surgical History:  Procedure Laterality Date   COLONOSCOPY     DILATION AND CURETTAGE OF UTERUS  1998    Prior to Admission medications   Medication Sig Start Date End Date Taking? Authorizing Provider  atenolol (TENORMIN) 50 MG tablet Take 50 mg by mouth daily. 10/24/22  Yes [provider]  lisinopril (ZESTRIL) 20 MG tablet Take 20 mg by mouth daily. 10/24/22  Yes [provider]  simvastatin (ZOCOR) 20 MG tablet Take 20 mg by mouth daily. 10/24/22  Yes  [provider]    Current Outpatient Medications  Medication Sig Dispense Refill   atenolol (TENORMIN) 50 MG tablet Take 50 mg by mouth daily.     lisinopril (ZESTRIL) 20 MG tablet Take 20 mg by mouth daily.     simvastatin (ZOCOR) 20 MG tablet Take 20 mg by mouth daily.     Current Facility-Administered Medications  Medication Dose Route Frequency Provider Last Rate Last Admin   0.9 %  sodium chloride infusion  500 mL Intravenous Once Iva Boop, MD        Allergies as of 02/12/2023   (No Known Allergies)    Family History  Problem Relation Age of Onset   Diabetes Mother        pre-diabetes   Hypertension Mother    Migraines Mother    Thyroid disease Mother    Hyperlipidemia Mother    Hypertension Father    Glaucoma Father    Hypertension Brother    Hyperlipidemia Brother    Colon cancer Maternal Grandmother    Stomach cancer Maternal Grandmother    Lung cancer Maternal Grandfather    Stroke Paternal Grandmother    Breast cancer Paternal Aunt    Hyperlipidemia Son    Hypertension Son    Colon polyps Neg Hx    Esophageal cancer Neg Hx  Rectal cancer Neg Hx     Social History   Socioeconomic History   Marital status: Single    Spouse name: Not on file   Number of children: 2   Years of education: Not on file   Highest education level: Not on file  Occupational History   Occupation: retired  Tobacco Use   Smoking status: Never   Smokeless tobacco: Never  Vaping Use   Vaping Use: Never used  Substance and Sexual Activity   Alcohol use: Yes    Comment: occ- wine   Drug use: No   Sexual activity: Not on file  Other Topics Concern   Not on file  Social History Narrative   Single      Son is Financial planner   Social Determinants of Health   Financial Resource Strain: Not on file  Food Insecurity: Not on file  Transportation Needs: Not on file  Physical Activity: Not on file  Stress: Not on file  Social Connections: Not on file   Intimate Partner Violence: Not on file    Review of Systems:  All other review of systems negative except as mentioned in the HPI.  Physical Exam: Vital signs BP (!) 173/78 (BP Location: Right Arm, Patient Position: Sitting, Cuff Size: Small)   Pulse 62   Temp (!) 97.3 F (36.3 C) (Temporal)   Ht 4\' 10"  (1.473 m)   Wt 123 lb (55.8 kg)   SpO2 98%   BMI 25.71 kg/m   General:   Alert,  Well-developed, well-nourished, pleasant and cooperative in NAD Lungs:  Clear throughout to auscultation.   Heart:  Regular rate and rhythm; no murmurs, clicks, rubs,  or gallops. Abdomen:  Soft, nontender and nondistended. Normal bowel sounds.   Neuro/Psych:  Alert and cooperative. Normal mood and affect. A and O x 3   @Selyna Klahn  Sena Slate, MD, [facility] [phone] (pager) 02/12/2023 1:14 PM@

## 2023-02-12 NOTE — Progress Notes (Signed)
Vitals-Katherine Chang  Pt's states no medical or surgical changes since previsit or office visit. 

## 2023-02-12 NOTE — Progress Notes (Signed)
1319 Robinul 0.1 mg IV given due large amount of secretions upon assessment.  MD made aware, vss  ?

## 2023-02-12 NOTE — Op Note (Signed)
[facility] Patient Name: Katherine Chang Procedure Date: 02/12/2023 12:48 PM MRN: [id number] Endoscopist: Iva Boop , MD, [id number] Age: 77 Referring MD:  Date of Birth: [birth year] Gender: Female Account #: [id number] Procedure:                Colonoscopy Indications:              Heme positive stool, Anemia Procedure:                Pre-Anesthesia Assessment:                           - Prior to the procedure, a History and Physical                            was performed, and patient medications and                            allergies were reviewed. The patient's tolerance of                            previous anesthesia was also reviewed. The risks                            and benefits of the procedure and the sedation                            options and risks were discussed with the patient.                            All questions were answered, and informed consent                            was obtained. Prior Anticoagulants: The patient has                            taken no anticoagulant or antiplatelet agents. ASA                            Grade Assessment: II - A patient with mild systemic                            disease. After reviewing the risks and benefits,                            the patient was deemed in satisfactory condition to                            undergo the procedure.                           After obtaining informed consent, the colonoscope                            was passed under direct vision. Throughout the  procedure, the patient's blood pressure, pulse, and                            oxygen saturations were monitored continuously. The                            [id number]-[id number] Colonoscope [id number] was introduced                            through the anus and advanced to the the cecum,                            identified by appendiceal orifice and ileocecal                            valve. The  colonoscopy was performed without                            difficulty. The patient tolerated the procedure                            well. The quality of the bowel preparation was                            excellent. The ileocecal valve, appendiceal                            orifice, and rectum were photographed. The bowel                            preparation used was Miralax via split dose                            instruction. Scope In: 1:28:07 PM Scope Out: 1:37:14 PM Scope Withdrawal Time: 0 hours 7 minutes 8 seconds  Total Procedure Duration: 0 hours 9 minutes 7 seconds  Findings:                 The perianal and digital rectal examinations were                            normal.                           External and internal hemorrhoids were found.                           Multiple diverticula were found in the sigmoid                            colon.                           The exam was otherwise without abnormality on  direct and retroflexion views. Complications:            No immediate complications. Estimated Blood Loss:     Estimated blood loss: none. Impression:               - External and internal hemorrhoids. Suspect these                            wer cause of heme + stool (EGD was also negative)                           - Diverticulosis in the sigmoid colon.                           - The examination was otherwise normal on direct                            and retroflexion views.                           - No specimens collected. Recommendation:           - Patient has a contact number available for                            emergencies. The signs and symptoms of potential                            delayed complications were discussed with the                            patient. Return to normal activities tomorrow.                            Written discharge instructions were provided to the                             patient.                           - Resume previous diet.                           - Continue present medications.                           - No repeat colonoscopy due to age and the absence                            of colonic polyps. Iva Boop, MD 02/12/2023 1:46:49 PM This report has been signed electronically.

## 2023-02-12 NOTE — Telephone Encounter (Signed)
Inbound call from patient son, wanting to report that his mom is having elevate blood pressure and want to make sure she will be good for today procedure .Please advise.

## 2023-02-12 NOTE — Patient Instructions (Addendum)
Nothing bad seen.  The esophagus, stomach and upper intestine are normal.  The colonoscopy showed diverticulosis as before and some swollen hemorrhoids - I suspect that is where the blood seen on the stool hemoccult test came from.  No further testing needed in my opinion.  I appreciate the opportunity to care for you. Iva Boop, MD, [facility]  Handouts Provided:  Diverticulosis  YOU HAD AN ENDOSCOPIC PROCEDURE TODAY AT THE [facility]:   Refer to the procedure report that was given to you for any specific questions about what was found during the examination.  If the procedure report does not answer your questions, please call your gastroenterologist to clarify.  If you requested that your care partner not be given the details of your procedure findings, then the procedure report has been included in a sealed envelope for you to review at your convenience later.  YOU SHOULD EXPECT: Some feelings of bloating in the abdomen. Passage of more gas than usual.  Walking can help get rid of the air that was put into your GI tract during the procedure and reduce the bloating. If you had a lower endoscopy (such as a colonoscopy or flexible sigmoidoscopy) you may notice spotting of blood in your stool or on the toilet paper. If you underwent a bowel prep for your procedure, you may not have a normal bowel movement for a few days.  Please Note:  You might notice some irritation and congestion in your nose or some drainage.  This is from the oxygen used during your procedure.  There is no need for concern and it should clear up in a day or so.  SYMPTOMS TO REPORT IMMEDIATELY:  Following lower endoscopy (colonoscopy or flexible sigmoidoscopy):  Excessive amounts of blood in the stool  Significant tenderness or worsening of abdominal pains  Swelling of the abdomen that is new, acute  Fever of 100F or higher  Following upper endoscopy (EGD)  Vomiting of blood or coffee ground  material  New chest pain or pain under the shoulder blades  Painful or persistently difficult swallowing  New shortness of breath  Fever of 100F or higher  Black, tarry-looking stools  For urgent or emergent issues, a gastroenterologist can be reached at any hour by calling [phone]. Do not use MyChart messaging for urgent concerns.    DIET:  We do recommend a small meal at first, but then you may proceed to your regular diet.  Drink plenty of fluids but you should avoid alcoholic beverages for 24 hours.  ACTIVITY:  You should plan to take it easy for the rest of today and you should NOT DRIVE or use heavy machinery until tomorrow (because of the sedation medicines used during the test).    FOLLOW UP: Our staff will call the number listed on your records the next business day following your procedure.  We will call around 7:15- 8:00 am to check on you and address any questions or concerns that you may have regarding the information given to you following your procedure. If we do not reach you, we will leave a message.     If any biopsies were taken you will be contacted by phone or by letter within the next 1-3 weeks.  Please call us at [phone] if you have not heard about the biopsies in 3 weeks.    SIGNATURES/CONFIDENTIALITY: You and/or your care partner have signed paperwork which will be entered into your electronic medical record.  These signatures  attest to the fact that that the information above on your After Visit Summary has been reviewed and is understood.  Full responsibility of the confidentiality of this discharge information lies with you and/or your care-partner.

## 2023-02-13 ENCOUNTER — Telehealth: Payer: Self-pay | Admitting: *Deleted

## 2023-02-13 NOTE — Telephone Encounter (Signed)
  Follow up Call-     02/12/2023   12:46 PM 02/12/2023   12:34 PM  Call back number  Post procedure Call Back phone  # [phone]   Permission to leave phone message  Yes     Patient questions:  Do you have a fever, pain , or abdominal swelling? No. Pain Score  0 *  Have you tolerated food without any problems? Yes.    Have you been able to return to your normal activities? Yes.    Do you have any questions about your discharge instructions: Diet   No. Medications  No. Follow up visit  No.  Do you have questions or concerns about your Care? No.  Actions: * If pain score is 4 or above: No action needed, pain <4.

## 2023-02-21 ENCOUNTER — Other Ambulatory Visit: Payer: Self-pay | Admitting: Family Medicine

## 2023-02-21 DIAGNOSIS — Z1231 Encounter for screening mammogram for malignant neoplasm of breast: Secondary | ICD-10-CM

## 2023-02-24 DIAGNOSIS — L814 Other melanin hyperpigmentation: Secondary | ICD-10-CM | POA: Diagnosis not present

## 2023-02-24 DIAGNOSIS — B078 Other viral warts: Secondary | ICD-10-CM | POA: Diagnosis not present

## 2023-02-24 DIAGNOSIS — Z85828 Personal history of other malignant neoplasm of skin: Secondary | ICD-10-CM | POA: Diagnosis not present

## 2023-02-26 DIAGNOSIS — N1831 Chronic kidney disease, stage 3a: Secondary | ICD-10-CM | POA: Diagnosis not present

## 2023-02-26 DIAGNOSIS — R7303 Prediabetes: Secondary | ICD-10-CM | POA: Diagnosis not present

## 2023-02-26 DIAGNOSIS — D649 Anemia, unspecified: Secondary | ICD-10-CM | POA: Diagnosis not present

## 2023-02-26 DIAGNOSIS — E7800 Pure hypercholesterolemia, unspecified: Secondary | ICD-10-CM | POA: Diagnosis not present

## 2023-02-26 DIAGNOSIS — I10 Essential (primary) hypertension: Secondary | ICD-10-CM | POA: Diagnosis not present

## 2023-03-27 ENCOUNTER — Ambulatory Visit: Payer: PPO

## 2023-03-31 ENCOUNTER — Ambulatory Visit
Admission: RE | Admit: 2023-03-31 | Discharge: 2023-03-31 | Disposition: A | Discharge status: Home / Self Care | Payer: PPO | Source: Ambulatory Visit | Attending: Family Medicine | Admitting: Family Medicine

## 2023-03-31 DIAGNOSIS — Z1231 Encounter for screening mammogram for malignant neoplasm of breast: Secondary | ICD-10-CM | POA: Diagnosis not present

## 2023-06-20 DIAGNOSIS — Z23 Encounter for immunization: Secondary | ICD-10-CM | POA: Diagnosis not present

## 2023-06-23 ENCOUNTER — Other Ambulatory Visit: Payer: Self-pay | Admitting: Family Medicine

## 2023-06-23 DIAGNOSIS — M8580 Other specified disorders of bone density and structure, unspecified site: Secondary | ICD-10-CM

## 2023-06-25 DIAGNOSIS — R220 Localized swelling, mass and lump, head: Secondary | ICD-10-CM | POA: Diagnosis not present

## 2023-06-25 DIAGNOSIS — T783XXA Angioneurotic edema, initial encounter: Secondary | ICD-10-CM | POA: Diagnosis not present

## 2023-11-13 DIAGNOSIS — D224 Melanocytic nevi of scalp and neck: Secondary | ICD-10-CM | POA: Diagnosis not present

## 2023-11-13 DIAGNOSIS — C44311 Basal cell carcinoma of skin of nose: Secondary | ICD-10-CM | POA: Diagnosis not present

## 2023-11-13 DIAGNOSIS — L438 Other lichen planus: Secondary | ICD-10-CM | POA: Diagnosis not present

## 2023-11-13 DIAGNOSIS — D2372 Other benign neoplasm of skin of left lower limb, including hip: Secondary | ICD-10-CM | POA: Diagnosis not present

## 2023-11-13 DIAGNOSIS — L821 Other seborrheic keratosis: Secondary | ICD-10-CM | POA: Diagnosis not present

## 2023-11-13 DIAGNOSIS — Z85828 Personal history of other malignant neoplasm of skin: Secondary | ICD-10-CM | POA: Diagnosis not present

## 2023-11-13 DIAGNOSIS — D485 Neoplasm of uncertain behavior of skin: Secondary | ICD-10-CM | POA: Diagnosis not present

## 2023-11-13 DIAGNOSIS — D1801 Hemangioma of skin and subcutaneous tissue: Secondary | ICD-10-CM | POA: Diagnosis not present

## 2023-11-13 DIAGNOSIS — L814 Other melanin hyperpigmentation: Secondary | ICD-10-CM | POA: Diagnosis not present

## 2023-12-23 DIAGNOSIS — C44311 Basal cell carcinoma of skin of nose: Secondary | ICD-10-CM | POA: Diagnosis not present

## 2023-12-23 DIAGNOSIS — Z85828 Personal history of other malignant neoplasm of skin: Secondary | ICD-10-CM | POA: Diagnosis not present

## 2024-01-22 DIAGNOSIS — I10 Essential (primary) hypertension: Secondary | ICD-10-CM | POA: Diagnosis not present

## 2024-01-22 DIAGNOSIS — N1831 Chronic kidney disease, stage 3a: Secondary | ICD-10-CM | POA: Diagnosis not present

## 2024-02-16 DIAGNOSIS — I10 Essential (primary) hypertension: Secondary | ICD-10-CM | POA: Diagnosis not present

## 2024-02-16 DIAGNOSIS — E7800 Pure hypercholesterolemia, unspecified: Secondary | ICD-10-CM | POA: Diagnosis not present

## 2024-02-16 DIAGNOSIS — N1831 Chronic kidney disease, stage 3a: Secondary | ICD-10-CM | POA: Diagnosis not present

## 2024-03-02 ENCOUNTER — Other Ambulatory Visit: Payer: PPO

## 2024-03-04 DIAGNOSIS — I10 Essential (primary) hypertension: Secondary | ICD-10-CM | POA: Diagnosis not present

## 2024-03-04 DIAGNOSIS — R7303 Prediabetes: Secondary | ICD-10-CM | POA: Diagnosis not present

## 2024-03-04 DIAGNOSIS — E7800 Pure hypercholesterolemia, unspecified: Secondary | ICD-10-CM | POA: Diagnosis not present

## 2024-03-04 DIAGNOSIS — M8580 Other specified disorders of bone density and structure, unspecified site: Secondary | ICD-10-CM | POA: Diagnosis not present

## 2024-03-04 DIAGNOSIS — N1831 Chronic kidney disease, stage 3a: Secondary | ICD-10-CM | POA: Diagnosis not present

## 2024-03-05 ENCOUNTER — Other Ambulatory Visit: Payer: Self-pay | Admitting: Family Medicine

## 2024-03-05 DIAGNOSIS — Z1231 Encounter for screening mammogram for malignant neoplasm of breast: Secondary | ICD-10-CM

## 2024-03-18 DIAGNOSIS — N1831 Chronic kidney disease, stage 3a: Secondary | ICD-10-CM | POA: Diagnosis not present

## 2024-03-18 DIAGNOSIS — I10 Essential (primary) hypertension: Secondary | ICD-10-CM | POA: Diagnosis not present

## 2024-03-18 DIAGNOSIS — E7800 Pure hypercholesterolemia, unspecified: Secondary | ICD-10-CM | POA: Diagnosis not present

## 2024-04-02 ENCOUNTER — Ambulatory Visit
Admission: RE | Admit: 2024-04-02 | Discharge: 2024-04-02 | Disposition: A | Discharge status: Home / Self Care | Source: Ambulatory Visit | Attending: Family Medicine | Admitting: Family Medicine

## 2024-04-02 DIAGNOSIS — Z1231 Encounter for screening mammogram for malignant neoplasm of breast: Secondary | ICD-10-CM | POA: Diagnosis not present

## 2024-04-18 DIAGNOSIS — I10 Essential (primary) hypertension: Secondary | ICD-10-CM | POA: Diagnosis not present

## 2024-04-18 DIAGNOSIS — E7800 Pure hypercholesterolemia, unspecified: Secondary | ICD-10-CM | POA: Diagnosis not present

## 2024-04-18 DIAGNOSIS — N1831 Chronic kidney disease, stage 3a: Secondary | ICD-10-CM | POA: Diagnosis not present

## 2024-05-06 DIAGNOSIS — Z85828 Personal history of other malignant neoplasm of skin: Secondary | ICD-10-CM | POA: Diagnosis not present

## 2024-05-06 DIAGNOSIS — L821 Other seborrheic keratosis: Secondary | ICD-10-CM | POA: Diagnosis not present

## 2024-05-06 DIAGNOSIS — L570 Actinic keratosis: Secondary | ICD-10-CM | POA: Diagnosis not present

## 2024-05-06 DIAGNOSIS — L814 Other melanin hyperpigmentation: Secondary | ICD-10-CM | POA: Diagnosis not present

## 2024-05-06 DIAGNOSIS — L578 Other skin changes due to chronic exposure to nonionizing radiation: Secondary | ICD-10-CM | POA: Diagnosis not present

## 2024-05-18 DIAGNOSIS — E7800 Pure hypercholesterolemia, unspecified: Secondary | ICD-10-CM | POA: Diagnosis not present

## 2024-05-18 DIAGNOSIS — I10 Essential (primary) hypertension: Secondary | ICD-10-CM | POA: Diagnosis not present

## 2024-05-18 DIAGNOSIS — N1831 Chronic kidney disease, stage 3a: Secondary | ICD-10-CM | POA: Diagnosis not present

## 2024-06-07 DIAGNOSIS — J069 Acute upper respiratory infection, unspecified: Secondary | ICD-10-CM | POA: Diagnosis not present

## 2024-06-07 DIAGNOSIS — I10 Essential (primary) hypertension: Secondary | ICD-10-CM | POA: Diagnosis not present

## 2024-06-13 DIAGNOSIS — N1831 Chronic kidney disease, stage 3a: Secondary | ICD-10-CM | POA: Diagnosis not present

## 2024-06-13 DIAGNOSIS — I10 Essential (primary) hypertension: Secondary | ICD-10-CM | POA: Diagnosis not present

## 2024-06-18 DIAGNOSIS — E7800 Pure hypercholesterolemia, unspecified: Secondary | ICD-10-CM | POA: Diagnosis not present

## 2024-06-18 DIAGNOSIS — N1831 Chronic kidney disease, stage 3a: Secondary | ICD-10-CM | POA: Diagnosis not present

## 2024-06-18 DIAGNOSIS — I10 Essential (primary) hypertension: Secondary | ICD-10-CM | POA: Diagnosis not present

## 2024-07-18 DIAGNOSIS — N1831 Chronic kidney disease, stage 3a: Secondary | ICD-10-CM | POA: Diagnosis not present

## 2024-07-18 DIAGNOSIS — I10 Essential (primary) hypertension: Secondary | ICD-10-CM | POA: Diagnosis not present

## 2024-07-18 DIAGNOSIS — E7800 Pure hypercholesterolemia, unspecified: Secondary | ICD-10-CM | POA: Diagnosis not present

## 2024-07-29 DIAGNOSIS — L570 Actinic keratosis: Secondary | ICD-10-CM | POA: Diagnosis not present

## 2024-07-29 DIAGNOSIS — L820 Inflamed seborrheic keratosis: Secondary | ICD-10-CM | POA: Diagnosis not present

## 2024-07-29 DIAGNOSIS — L821 Other seborrheic keratosis: Secondary | ICD-10-CM | POA: Diagnosis not present

## 2024-07-29 DIAGNOSIS — B078 Other viral warts: Secondary | ICD-10-CM | POA: Diagnosis not present

## 2024-07-29 DIAGNOSIS — Z85828 Personal history of other malignant neoplasm of skin: Secondary | ICD-10-CM | POA: Diagnosis not present

## 2024-08-29 ENCOUNTER — Other Ambulatory Visit (HOSPITAL_BASED_OUTPATIENT_CLINIC_OR_DEPARTMENT_OTHER): Payer: Self-pay | Admitting: Family Medicine

## 2024-08-29 DIAGNOSIS — E2839 Other primary ovarian failure: Secondary | ICD-10-CM

## 2024-08-29 DIAGNOSIS — M8580 Other specified disorders of bone density and structure, unspecified site: Secondary | ICD-10-CM

## 2024-09-02 NOTE — Progress Notes (Signed)
 Katherine Chang                                          MRN: [id number]   09/02/2024   The [facility] Quality Team Specialist reviewed this patient medical record for the purposes of chart review for care gap closure. The following were reviewed: chart review for care gap closure-controlling blood pressure.    [facility] Quality Team

## 2025-04-27 ENCOUNTER — Ambulatory Visit (HOSPITAL_BASED_OUTPATIENT_CLINIC_OR_DEPARTMENT_OTHER)
